# Patient Record
Sex: Female | Born: 1960 | Race: White | Hispanic: No | Smoking: Never smoker
Health system: Southern US, Community
[De-identification: ages and names within clinical notes are randomized; demographics above are authoritative.]

## PROBLEM LIST (undated history)

## (undated) DIAGNOSIS — F329 Major depressive disorder, single episode, unspecified: Secondary | ICD-10-CM

## (undated) DIAGNOSIS — E039 Hypothyroidism, unspecified: Secondary | ICD-10-CM

## (undated) DIAGNOSIS — R7303 Prediabetes: Secondary | ICD-10-CM

## (undated) DIAGNOSIS — I10 Essential (primary) hypertension: Secondary | ICD-10-CM

## (undated) DIAGNOSIS — IMO0001 Reserved for inherently not codable concepts without codable children: Secondary | ICD-10-CM

## (undated) DIAGNOSIS — F32A Depression, unspecified: Secondary | ICD-10-CM

## (undated) DIAGNOSIS — G4730 Sleep apnea, unspecified: Secondary | ICD-10-CM

## (undated) DIAGNOSIS — J069 Acute upper respiratory infection, unspecified: Secondary | ICD-10-CM

## (undated) DIAGNOSIS — E119 Type 2 diabetes mellitus without complications: Secondary | ICD-10-CM

## (undated) HISTORY — DX: Sleep apnea, unspecified: G47.30

## (undated) HISTORY — PX: NO PAST SURGERIES: SHX2092

## (undated) HISTORY — DX: Essential (primary) hypertension: I10

## (undated) HISTORY — PX: CATARACT EXTRACTION: SUR2

## (undated) HISTORY — DX: Morbid (severe) obesity due to excess calories: E66.01

## (undated) HISTORY — DX: Type 2 diabetes mellitus without complications: E11.9

## (undated) HISTORY — DX: Prediabetes: R73.03

## (undated) HISTORY — PX: DILATION AND CURETTAGE OF UTERUS: SHX78

---

## 1999-06-04 ENCOUNTER — Other Ambulatory Visit: Admission: RE | Admit: 1999-06-04 | Discharge: 1999-06-04 | Payer: Self-pay | Admitting: Obstetrics and Gynecology

## 2000-08-02 ENCOUNTER — Other Ambulatory Visit: Admission: RE | Admit: 2000-08-02 | Discharge: 2000-08-02 | Payer: Self-pay | Admitting: Obstetrics and Gynecology

## 2001-09-07 ENCOUNTER — Other Ambulatory Visit: Admission: RE | Admit: 2001-09-07 | Discharge: 2001-09-07 | Payer: Self-pay | Admitting: Obstetrics and Gynecology

## 2002-11-05 ENCOUNTER — Other Ambulatory Visit: Admission: RE | Admit: 2002-11-05 | Discharge: 2002-11-05 | Payer: Self-pay | Admitting: Obstetrics and Gynecology

## 2004-03-04 ENCOUNTER — Other Ambulatory Visit: Admission: RE | Admit: 2004-03-04 | Discharge: 2004-03-04 | Payer: Self-pay | Admitting: Obstetrics and Gynecology

## 2004-04-06 ENCOUNTER — Encounter: Admission: RE | Admit: 2004-04-06 | Discharge: 2004-04-06 | Payer: Self-pay | Admitting: Obstetrics and Gynecology

## 2005-10-13 ENCOUNTER — Other Ambulatory Visit: Admission: RE | Admit: 2005-10-13 | Discharge: 2005-10-13 | Payer: Self-pay | Admitting: Obstetrics and Gynecology

## 2010-12-06 ENCOUNTER — Encounter: Payer: Self-pay | Admitting: Obstetrics and Gynecology

## 2013-03-19 ENCOUNTER — Emergency Department (HOSPITAL_COMMUNITY)
Admission: EM | Admit: 2013-03-19 | Discharge: 2013-03-19 | Disposition: A | Discharge status: Home / Self Care | Payer: BC Managed Care – PPO | Attending: Emergency Medicine | Admitting: Emergency Medicine

## 2013-03-19 DIAGNOSIS — Y990 Civilian activity done for income or pay: Secondary | ICD-10-CM | POA: Insufficient documentation

## 2013-03-19 DIAGNOSIS — Z880 Allergy status to penicillin: Secondary | ICD-10-CM | POA: Insufficient documentation

## 2013-03-19 DIAGNOSIS — Z79899 Other long term (current) drug therapy: Secondary | ICD-10-CM | POA: Insufficient documentation

## 2013-03-19 DIAGNOSIS — W010XXA Fall on same level from slipping, tripping and stumbling without subsequent striking against object, initial encounter: Secondary | ICD-10-CM | POA: Insufficient documentation

## 2013-03-19 DIAGNOSIS — S81009A Unspecified open wound, unspecified knee, initial encounter: Secondary | ICD-10-CM | POA: Insufficient documentation

## 2013-03-19 DIAGNOSIS — Z23 Encounter for immunization: Secondary | ICD-10-CM | POA: Insufficient documentation

## 2013-03-19 DIAGNOSIS — S81812A Laceration without foreign body, left lower leg, initial encounter: Secondary | ICD-10-CM

## 2013-03-19 DIAGNOSIS — Y9269 Other specified industrial and construction area as the place of occurrence of the external cause: Secondary | ICD-10-CM | POA: Insufficient documentation

## 2013-03-19 MED ORDER — MUPIROCIN CALCIUM 2 % EX CREA
TOPICAL_CREAM | Freq: Two times a day (BID) | CUTANEOUS | Status: DC
  Administered 2013-03-19: 1 via TOPICAL
  Filled 2013-03-19: qty 15

## 2013-03-19 MED ORDER — LORAZEPAM 0.5 MG PO TABS
0.5000 mg | ORAL_TABLET | Freq: Once | ORAL | Status: AC
  Administered 2013-03-19: 0.5 mg via ORAL
  Filled 2013-03-19: qty 1

## 2013-03-19 MED ORDER — TETANUS-DIPHTH-ACELL PERTUSSIS 5-2.5-18.5 LF-MCG/0.5 IM SUSP
0.5000 mL | Freq: Once | INTRAMUSCULAR | Status: AC
  Administered 2013-03-19: 0.5 mL via INTRAMUSCULAR
  Filled 2013-03-19: qty 0.5

## 2013-03-19 NOTE — ED Provider Notes (Signed)
History  This chart was scribed for Gerhard Munch, MD by Ardeen Jourdain, ED Scribe. This patient was seen in room TR05C/TR05C and the patient's care was started at 1608.  CSN: [id number]  Arrival date & time 03/19/13  1227   None     Chief Complaint  Patient presents with  . Laceration     The history is provided by the patient. No language interpreter was used.    Tammy Wyatt is a 52 y.o. female who presents to the Emergency Department complaining of a laceration to her LLE. Pt states she was trying to step onto a pallet at work when she tripped and fell, cutting her leg. She denies any LOC, head trauma, confusion, nausea and emesis as associated symptoms. Pt states she is able to ambulate.    No past medical history on file.  No past surgical history on file.  No family history on file.  History  Substance Use Topics  . Smoking status: Not on file  . Smokeless tobacco: Not on file  . Alcohol Use: Not on file   No OB history available.  Review of Systems  Constitutional:       Per HPI, otherwise negative  HENT:       Per HPI, otherwise negative  Respiratory:       Per HPI, otherwise negative  Cardiovascular:       Per HPI, otherwise negative  Gastrointestinal: Negative for vomiting.  Endocrine:       Negative aside from HPI  Genitourinary:       Neg aside from HPI   Musculoskeletal:       Per HPI, otherwise negative  Skin: Negative.   Neurological: Negative for syncope.  All other systems reviewed and are negative.    Allergies  Penicillins  Home Medications   Current Outpatient Rx  Name  Route  Sig  Dispense  Refill  . clindamycin (CLEOCIN) 300 MG capsule   Oral   Take 300 mg by mouth every 6 (six) hours.         . furosemide (LASIX) 40 MG tablet   Oral   Take 40 mg by mouth daily.         Marland Kitchen lisinopril (PRINIVIL,ZESTRIL) 20 MG tablet   Oral   Take 20 mg by mouth daily.         . Multiple Vitamin (MULTIVITAMIN WITH MINERALS)  TABS   Oral   Take 1 tablet by mouth daily.         . mupirocin ointment (BACTROBAN) 2 %   Topical   Apply 1 application topically 3 (three) times daily.         . vitamin C (ASCORBIC ACID) 500 MG tablet   Oral   Take 500 mg by mouth daily.           Triage Vitals: BP 131/59  Pulse 80  Temp(Src) 98.9 F (37.2 C) (Oral)  Resp 24  SpO2 93%  LMP 01/17/2013  Physical Exam  Nursing note and vitals reviewed. Constitutional: She is oriented to person, place, and time. She appears well-developed and well-nourished. No distress.  Morbidly obese F NAD  HENT:  Head: Normocephalic and atraumatic.  Eyes: Conjunctivae and EOM are normal.  Cardiovascular: Normal rate and regular rhythm.   Pulmonary/Chest: Effort normal and breath sounds normal. No stridor. No respiratory distress.  Abdominal: She exhibits no distension.  Musculoskeletal: She exhibits no edema.  Good flexion and extension to the left lower extremity  (  ankle) - no tendon / muscle visible beneath the wound.  R anterior shin lac widely separated w significant tension across the wound.  Neurological: She is alert and oriented to person, place, and time. No cranial nerve deficit.  Skin: Skin is warm and dry. She is not diaphoretic.   6 cm well healing laceration to posterior left lower extremity consistent with prior cellulitis   Psychiatric: She has a normal mood and affect.    ED Course  Procedures (including critical care time)  Labs Reviewed - No data to display No results found.   No diagnosis found.  LACERATION REPAIR Performed by: Gerhard Munch Authorized by: Gerhard Munch Consent: Verbal consent obtained. Risks and benefits: risks, benefits and alternatives were discussed Consent given by: patient Patient identity confirmed: provided demographic data Prepped and Draped in normal sterile fashion Wound explored  Laceration Location: L shin  Laceration Length: 12 cm  No Foreign Bodies seen  or palpated  Anesthesia: local infiltration  Local anesthetic: lidocaine 2%  epinephrine  Anesthetic total: 13 ml  Irrigation method: syringe Amount of cleaning: standard  Skin closure: close - as much as possible with skin tension  Number of sutures: 12  Technique: interupted  Patient tolerance: Patient tolerated the procedure well with no immediate complications.   MDM   I personally performed the services described in this documentation, which was scribed in my presence. The recorded information has been reviewed and is accurate.  Patient presents of a mechanical fall with a left anterior shin laceration.  The patient is obese, with significant distention across anterior shin, but the wound was closely approximated with 12 sutures.  The patient is already taking antibiotics prior cellulitis, and was advised to continue these.  Hasn't other complaints, she is appropriate for discharge with close outpatient followup as needed.     Gerhard Munch, MD 03/19/13 [phone]

## 2013-03-19 NOTE — ED Notes (Signed)
tetanus shot was assessed earlier and given to patient at 1542

## 2013-03-19 NOTE — ED Notes (Signed)
Pt reports falling while going up one stair. NO LOC ,did not hit head. Lac to LLE .

## 2013-04-17 ENCOUNTER — Encounter (HOSPITAL_BASED_OUTPATIENT_CLINIC_OR_DEPARTMENT_OTHER): Payer: BC Managed Care – PPO | Attending: General Surgery

## 2013-04-17 DIAGNOSIS — I10 Essential (primary) hypertension: Secondary | ICD-10-CM | POA: Insufficient documentation

## 2013-04-17 DIAGNOSIS — T8189XA Other complications of procedures, not elsewhere classified, initial encounter: Secondary | ICD-10-CM | POA: Insufficient documentation

## 2013-04-17 DIAGNOSIS — Z880 Allergy status to penicillin: Secondary | ICD-10-CM | POA: Insufficient documentation

## 2013-04-17 DIAGNOSIS — L089 Local infection of the skin and subcutaneous tissue, unspecified: Secondary | ICD-10-CM | POA: Insufficient documentation

## 2013-04-17 DIAGNOSIS — Y849 Medical procedure, unspecified as the cause of abnormal reaction of the patient, or of later complication, without mention of misadventure at the time of the procedure: Secondary | ICD-10-CM | POA: Insufficient documentation

## 2013-04-17 DIAGNOSIS — L97909 Non-pressure chronic ulcer of unspecified part of unspecified lower leg with unspecified severity: Secondary | ICD-10-CM | POA: Insufficient documentation

## 2013-04-17 DIAGNOSIS — Z79899 Other long term (current) drug therapy: Secondary | ICD-10-CM | POA: Insufficient documentation

## 2013-04-18 ENCOUNTER — Encounter (HOSPITAL_COMMUNITY): Payer: Self-pay | Admitting: Cardiovascular Disease

## 2013-04-18 ENCOUNTER — Other Ambulatory Visit (HOSPITAL_COMMUNITY): Payer: Self-pay | Admitting: General Surgery

## 2013-04-18 DIAGNOSIS — I872 Venous insufficiency (chronic) (peripheral): Secondary | ICD-10-CM

## 2013-04-18 DIAGNOSIS — I739 Peripheral vascular disease, unspecified: Secondary | ICD-10-CM

## 2013-04-18 NOTE — Progress Notes (Signed)
[facility]  NAME:  Tammy Wyatt, Tammy Wyatt NO.:  [id number]  MEDICAL RECORD NO.:  [id number]      DATE OF BIRTH:  26-Jun-1961  PHYSICIAN:  Ardath Sax, M.D.      VISIT DATE:  04/17/2013                                  OFFICE VISIT   HISTORY:  This is a morbidly obese 52 year old Caucasian female who enters after suffering trauma to the anterior aspect of her left leg, and this was sutured and has come apart.  The sutures are all well, but she still has an open wound that is nonhealing wound, but it has been classified as venous hypertension and a chronic venous ulcer, and we are treating it with Santyl and an Radio broadcast assistant.  Today, I debrided some devitalized tissue out of the wound.  She was noted today to have a blood pressure of 161/90, respirations 18, temperature 98.7, pulse 75. She weighs 360 pounds and is 5 feet 3 inches tall.  She is on many medicines including she is on Bactrim for the infection on her legs. She is also on clindamycin.  She says she is allergic to penicillin. She also takes Lasix 40 mg a day and lisinopril 20 mg a day for hypertension.  I think this should heal up very nicely.  We eventually put some collagen on this wound when it is cleaned up, and we put Unna boot on her today for compression.  The wound is about 4-cm long, but it is only about 3-mm wide, and she has excellent pedal pulses, so I think she should heal reasonably quickly.     Ardath Sax, M.D.     PP/MEDQ  D:  04/17/2013  T:  04/18/2013  Job:  [id number]

## 2013-04-26 ENCOUNTER — Ambulatory Visit (HOSPITAL_BASED_OUTPATIENT_CLINIC_OR_DEPARTMENT_OTHER)
Admission: RE | Admit: 2013-04-26 | Discharge: 2013-04-26 | Disposition: A | Discharge status: Home / Self Care | Payer: BC Managed Care – PPO | Source: Ambulatory Visit | Attending: Cardiovascular Disease | Admitting: Cardiovascular Disease

## 2013-04-26 ENCOUNTER — Ambulatory Visit (HOSPITAL_COMMUNITY)
Admission: RE | Admit: 2013-04-26 | Discharge: 2013-04-26 | Disposition: A | Discharge status: Home / Self Care | Payer: BC Managed Care – PPO | Source: Ambulatory Visit | Attending: Cardiovascular Disease | Admitting: Cardiovascular Disease

## 2013-04-26 DIAGNOSIS — I739 Peripheral vascular disease, unspecified: Secondary | ICD-10-CM | POA: Insufficient documentation

## 2013-04-26 DIAGNOSIS — M79609 Pain in unspecified limb: Secondary | ICD-10-CM

## 2013-04-26 DIAGNOSIS — M7989 Other specified soft tissue disorders: Secondary | ICD-10-CM

## 2013-04-26 DIAGNOSIS — I872 Venous insufficiency (chronic) (peripheral): Secondary | ICD-10-CM

## 2013-04-26 NOTE — Progress Notes (Signed)
Venous Duplex Lower Ext. Completed. Deepa Barthel, RDMS, RVT  

## 2013-04-26 NOTE — Progress Notes (Signed)
Arterial Lower Ext. Duplex Completed. Raissa Dam, RDMS, RVT  

## 2013-05-16 ENCOUNTER — Encounter (HOSPITAL_BASED_OUTPATIENT_CLINIC_OR_DEPARTMENT_OTHER): Payer: BC Managed Care – PPO | Attending: General Surgery

## 2013-05-16 DIAGNOSIS — E669 Obesity, unspecified: Secondary | ICD-10-CM | POA: Insufficient documentation

## 2013-05-16 DIAGNOSIS — L97909 Non-pressure chronic ulcer of unspecified part of unspecified lower leg with unspecified severity: Secondary | ICD-10-CM | POA: Insufficient documentation

## 2013-06-20 ENCOUNTER — Encounter (HOSPITAL_BASED_OUTPATIENT_CLINIC_OR_DEPARTMENT_OTHER): Payer: BC Managed Care – PPO | Attending: General Surgery

## 2013-06-20 DIAGNOSIS — I87309 Chronic venous hypertension (idiopathic) without complications of unspecified lower extremity: Secondary | ICD-10-CM | POA: Insufficient documentation

## 2013-06-20 DIAGNOSIS — E119 Type 2 diabetes mellitus without complications: Secondary | ICD-10-CM | POA: Insufficient documentation

## 2013-06-20 DIAGNOSIS — I890 Lymphedema, not elsewhere classified: Secondary | ICD-10-CM | POA: Insufficient documentation

## 2013-06-20 DIAGNOSIS — L97809 Non-pressure chronic ulcer of other part of unspecified lower leg with unspecified severity: Secondary | ICD-10-CM | POA: Insufficient documentation

## 2013-07-05 NOTE — Progress Notes (Signed)
[facility]  NAME:  Tammy Wyatt, Tammy Wyatt NO.:  [id number]  MEDICAL RECORD NO.:  [id number]      DATE OF BIRTH:  Jun 21, 1961  PHYSICIAN:  Ardath Sax, M.D.           VISIT DATE:                                  OFFICE VISIT   This is a 52 year old morbidly obese lady who has severe venous hypertension and bilateral lymphedema of her lower extremities.  We have been treating her with Unna boots, and we have managed to clear the ulcers nicely.  I think the lymphedema is enough of her problem, and we are applying for home lymphedema pumps for her use 2-3 hours a day to get rid of some of this constant edema of her lower extremities.  I think this would help her diabetes, help her to exercise more and certainly help her to lose weight with the ability to exercise.     Ardath Sax, M.D.     PP/MEDQ  D:  07/04/2013  T:  07/05/2013  Job:  [id number]

## 2013-11-15 DIAGNOSIS — J069 Acute upper respiratory infection, unspecified: Secondary | ICD-10-CM

## 2013-11-15 HISTORY — DX: Acute upper respiratory infection, unspecified: J06.9

## 2014-05-09 ENCOUNTER — Ambulatory Visit: Payer: BC Managed Care – PPO | Attending: Nurse Practitioner | Admitting: Physical Therapy

## 2014-05-09 DIAGNOSIS — M545 Low back pain, unspecified: Secondary | ICD-10-CM | POA: Insufficient documentation

## 2014-05-09 DIAGNOSIS — M25559 Pain in unspecified hip: Secondary | ICD-10-CM | POA: Insufficient documentation

## 2014-05-09 DIAGNOSIS — IMO0001 Reserved for inherently not codable concepts without codable children: Secondary | ICD-10-CM | POA: Insufficient documentation

## 2014-05-21 ENCOUNTER — Encounter: Payer: BC Managed Care – PPO | Admitting: Rehabilitation

## 2014-05-23 ENCOUNTER — Encounter: Payer: BC Managed Care – PPO | Admitting: Rehabilitation

## 2014-05-27 ENCOUNTER — Encounter: Payer: BC Managed Care – PPO | Admitting: Physical Therapy

## 2014-05-30 ENCOUNTER — Encounter: Payer: BC Managed Care – PPO | Admitting: Rehabilitation

## 2014-11-13 ENCOUNTER — Other Ambulatory Visit: Payer: Self-pay

## 2014-11-13 ENCOUNTER — Encounter (HOSPITAL_COMMUNITY)
Admission: RE | Admit: 2014-11-13 | Discharge: 2014-11-13 | Disposition: A | Discharge status: Home / Self Care | Payer: BC Managed Care – PPO | Source: Ambulatory Visit | Attending: Obstetrics and Gynecology | Admitting: Obstetrics and Gynecology

## 2014-11-13 ENCOUNTER — Encounter (HOSPITAL_COMMUNITY): Payer: Self-pay

## 2014-11-13 ENCOUNTER — Encounter (HOSPITAL_COMMUNITY): Payer: Self-pay | Admitting: Anesthesiology

## 2014-11-13 DIAGNOSIS — Z01812 Encounter for preprocedural laboratory examination: Secondary | ICD-10-CM | POA: Diagnosis not present

## 2014-11-13 HISTORY — DX: Major depressive disorder, single episode, unspecified: F32.9

## 2014-11-13 HISTORY — DX: Hypothyroidism, unspecified: E03.9

## 2014-11-13 HISTORY — DX: Depression, unspecified: F32.A

## 2014-11-13 HISTORY — DX: Essential (primary) hypertension: I10

## 2014-11-13 HISTORY — DX: Acute upper respiratory infection, unspecified: J06.9

## 2014-11-13 HISTORY — DX: Reserved for inherently not codable concepts without codable children: IMO0001

## 2014-11-13 LAB — COMPREHENSIVE METABOLIC PANEL
ALK PHOS: 58 U/L (ref 39–117)
ALT: 23 U/L (ref 0–35)
AST: 31 U/L (ref 0–37)
Albumin: 3.5 g/dL (ref 3.5–5.2)
Anion gap: 7 (ref 5–15)
BILIRUBIN TOTAL: 0.4 mg/dL (ref 0.3–1.2)
BUN: 14 mg/dL (ref 6–23)
CALCIUM: 8.9 mg/dL (ref 8.4–10.5)
CO2: 38 mmol/L — AB (ref 19–32)
CREATININE: 0.5 mg/dL (ref 0.50–1.10)
Chloride: 97 mEq/L (ref 96–112)
GFR calc Af Amer: >90 mL/min (ref >90)
GFR calc non Af Amer: >90 mL/min (ref >90)
GLUCOSE: 113 mg/dL — AB (ref 70–99)
Potassium: 4.5 mmol/L (ref 3.5–5.1)
SODIUM: 142 mmol/L (ref 135–145)
Total Protein: 7.8 g/dL (ref 6.0–8.3)

## 2014-11-13 LAB — CBC
HEMATOCRIT: 41.5 % (ref 36.0–46.0)
Hemoglobin: 11.8 g/dL — ABNORMAL LOW (ref 12.0–15.0)
MCH: 24 pg — ABNORMAL LOW (ref 26.0–34.0)
MCHC: 28.4 g/dL — ABNORMAL LOW (ref 30.0–36.0)
MCV: 84.5 fL (ref 78.0–100.0)
Platelets: 205 10*3/uL (ref 150–400)
RBC: 4.91 MIL/uL (ref 3.87–5.11)
RDW: 17.1 % — AB (ref 11.5–15.5)
WBC: 6.3 10*3/uL (ref 4.0–10.5)

## 2014-11-13 NOTE — Progress Notes (Signed)
Called to discuss preoperative evaluate for D&C, hysteroscopy scheduled on 1/8. On reviewing her chart, last seen by a cardiologist in 12/12 and at that time, was placed on PRN lasix. A TTE was recommended but deferred at that time secondary to financial difficulties of the patient but CHF could not be ruled out given her symptoms. She currently has METs <4 which she attributes mostly to her weight but has also reported a history of feeling like she has "fluid on her lungs" and has been dyspneic. She reports baseline lower extremity edema and sleeps on several pillows at night. She reports a recent history of a cold and cough which produces yellow sputum and wheezing. She also has several risk factors for sleep apnea which has not been tested for or addressed. I feel that this patient would greatly benefit from medical optimization of her sleep apnea and further evaluation of her dyspnea and edema which could be cardiac in nature. If this is not an urgent surgery, her risk for anesthetic complications would be greatly reduced if these issues could be addressed and optimized.

## 2014-11-13 NOTE — Patient Instructions (Signed)
Your procedure is scheduled on:11/22/13  Enter through the Main Entrance at :6am Pick up desk phone and dial [phone] and inform us of your arrival.  Please call [phone] if you have any problems the morning of surgery.  Remember: Do not eat food or drink liquids, including water, after midnight:Thursday   You may brush your teeth the morning of surgery.  Take these meds the morning of surgery with a sip of water:Lisinopril, Celexa  DO NOT wear jewelry, eye make-up, lipstick,body lotion, or dark fingernail polish.  (Polished toes are ok) You may wear deodorant.  If you are to be admitted after surgery, leave suitcase in car until your room has been assigned. Patients discharged on the day of surgery will not be allowed to drive home. Wear loose fitting, comfortable clothes for your ride home.

## 2014-11-13 NOTE — Pre-Procedure Instructions (Signed)
Dr. Gentry RochJudd notified of pt's OSA screening score of 7, also pt's current URI with productive cough. Dr. Gentry RochJudd saw pt and wants pt to be cleared for surgery by her PMD.  Corrie DandyMary in Dr. Huel Coventryomblin's office notified that surgery needs to be rescheduled after pt has been cleared for surgery and health issues addressed prior to surgery, ie OSA and echo that was advised in 2012 but not done due to pt's lack of insurance at that time.

## 2014-11-22 ENCOUNTER — Encounter (HOSPITAL_COMMUNITY): Admission: RE | Payer: Self-pay | Source: Ambulatory Visit

## 2014-11-22 ENCOUNTER — Ambulatory Visit (HOSPITAL_COMMUNITY)
Admission: RE | Admit: 2014-11-22 | Payer: BC Managed Care – PPO | Source: Ambulatory Visit | Admitting: Obstetrics and Gynecology

## 2014-11-22 SURGERY — DILATATION & CURETTAGE/HYSTEROSCOPY WITH TRUCLEAR
Anesthesia: Choice

## 2014-12-19 ENCOUNTER — Ambulatory Visit: Payer: Self-pay | Admitting: Cardiology

## 2015-01-02 ENCOUNTER — Encounter (HOSPITAL_COMMUNITY): Payer: Self-pay | Admitting: Emergency Medicine

## 2015-01-02 ENCOUNTER — Emergency Department (HOSPITAL_COMMUNITY)
Admission: EM | Admit: 2015-01-02 | Discharge: 2015-01-02 | Disposition: A | Discharge status: Home / Self Care | Payer: BLUE CROSS/BLUE SHIELD | Source: Home / Self Care | Attending: Family Medicine | Admitting: Family Medicine

## 2015-01-02 DIAGNOSIS — J40 Bronchitis, not specified as acute or chronic: Secondary | ICD-10-CM

## 2015-01-02 MED ORDER — GUAIFENESIN-CODEINE 100-10 MG/5ML PO SOLN: 5.0000 mL | Freq: Every evening | ORAL | Status: DC | PRN

## 2015-01-02 MED ORDER — IPRATROPIUM-ALBUTEROL 0.5-2.5 (3) MG/3ML IN SOLN
RESPIRATORY_TRACT | Status: AC
  Filled 2015-01-02: qty 3

## 2015-01-02 MED ORDER — AZITHROMYCIN 250 MG PO TABS: 250.0000 mg | ORAL_TABLET | Freq: Every day | ORAL | Status: DC

## 2015-01-02 MED ORDER — IPRATROPIUM BROMIDE 0.06 % NA SOLN: 2.0000 | Freq: Four times a day (QID) | NASAL | Status: DC

## 2015-01-02 MED ORDER — IPRATROPIUM-ALBUTEROL 0.5-2.5 (3) MG/3ML IN SOLN
3.0000 mL | Freq: Once | RESPIRATORY_TRACT | Status: AC
  Administered 2015-01-02: 3 mL via RESPIRATORY_TRACT

## 2015-01-02 MED ORDER — PREDNISONE 10 MG PO TABS: 30.0000 mg | ORAL_TABLET | Freq: Every day | ORAL | Status: DC

## 2015-01-02 MED ORDER — ALBUTEROL SULFATE HFA 108 (90 BASE) MCG/ACT IN AERS: 2.0000 | INHALATION_SPRAY | Freq: Four times a day (QID) | RESPIRATORY_TRACT | Status: AC | PRN

## 2015-01-02 NOTE — ED Provider Notes (Signed)
Tammy Wyatt is a 54 y.o. female who presents to Urgent Care today for coughing congestion associated with wheezing and some shortness of breath. Symptoms have been present now 3-4 days. Her symptoms are consistent with previous episodes of bronchitis that were well treated with prednisone and azithromycin. No vomiting or diarrhea. No chest pains or palpitations.   Past Medical History  Diagnosis Date  . Depression   . Hypertension   . Shortness of breath dyspnea     on exertion  . Hypothyroidism     24 yrs ago  . URI (upper respiratory infection) 2015   Past Surgical History  Procedure Laterality Date  . No past surgeries     History  Substance Use Topics  . Smoking status: Never Smoker   . Smokeless tobacco: Not on file  . Alcohol Use: Yes     Comment: rarely   ROS as above Medications: No current facility-administered medications for this encounter.   Current Outpatient Prescriptions  Medication Sig Dispense Refill  . citalopram (CELEXA) 40 MG tablet Take 40 mg by mouth daily.    . furosemide (LASIX) 40 MG tablet Take 40 mg by mouth daily.    Marland Kitchen. ibuprofen (ADVIL,MOTRIN) 200 MG tablet Take 400 mg by mouth daily as needed for mild pain.    Marland Kitchen. lisinopril (PRINIVIL,ZESTRIL) 20 MG tablet Take 20 mg by mouth daily.    Marland Kitchen. albuterol (PROVENTIL HFA;VENTOLIN HFA) 108 (90 BASE) MCG/ACT inhaler Inhale 2 puffs into the lungs every 6 (six) hours as needed for wheezing or shortness of breath. 1 Inhaler 2  . azithromycin (ZITHROMAX) 250 MG tablet Take 1 tablet (250 mg total) by mouth daily. Take first 2 tablets together, then 1 every day until finished. 6 tablet 0  . guaiFENesin-codeine 100-10 MG/5ML syrup Take 5 mLs by mouth at bedtime as needed for cough. 120 mL 0  . ipratropium (ATROVENT) 0.06 % nasal spray Place 2 sprays into both nostrils 4 (four) times daily. 15 mL 1  . predniSONE (DELTASONE) 10 MG tablet Take 3 tablets (30 mg total) by mouth daily. 15 tablet 0   Allergies  Allergen  Reactions  . Penicillins Hives and Rash     Exam:  BP 152/86 mmHg  Pulse 70  Temp(Src) 98.3 F (36.8 C) (Oral)  Resp 20  SpO2 96%  LMP 12/12/2014 (Exact Date) Gen: Well NAD morbidly obese HEENT: EOMI,  MMM posterior pharynx with cobblestoning Lungs: Normal work of breathing. Wheezing present bilaterally Heart: RRR no MRG Abd: NABS, Soft. Nondistended, Nontender Exts: Brisk capillary refill, warm and well perfused.   Patient was given a 2.5/0.5 mg DuoNeb nebulizer treatment and felt better  No results found for this or any previous visit (from the past 24 hour(s)). No results found.  Assessment and Plan: 54 y.o. female with bronchitis. Treat with Atrovent nasal spray, prednisone, azithromycin, albuterol, and codeine cough syrup. Return as needed.  Discussed warning signs or symptoms. Please see discharge instructions. Patient expresses understanding.     Rodolph BongEvan S Bora Bost, MD 01/02/15 1224

## 2015-01-02 NOTE — Discharge Instructions (Signed)
Thank you for coming in today. Call or go to the emergency room if you get worse, have trouble breathing, have chest pains, or palpitations.  Take codeine at bedtime. Do not drive after taking this medication  Acute Bronchitis Bronchitis is inflammation of the airways that extend from the windpipe into the lungs (bronchi). The inflammation often causes mucus to develop. This leads to a cough, which is the most common symptom of bronchitis.  In acute bronchitis, the condition usually develops suddenly and goes away over time, usually in a couple weeks. Smoking, allergies, and asthma can make bronchitis worse. Repeated episodes of bronchitis may cause further lung problems.  CAUSES Acute bronchitis is most often caused by the same virus that causes a cold. The virus can spread from person to person (contagious) through coughing, sneezing, and touching contaminated objects. SIGNS AND SYMPTOMS   Cough.   Fever.   Coughing up mucus.   Body aches.   Chest congestion.   Chills.   Shortness of breath.   Sore throat.  DIAGNOSIS  Acute bronchitis is usually diagnosed through a physical exam. Your health care provider will also ask you questions about your medical history. Tests, such as chest X-rays, are sometimes done to rule out other conditions.  TREATMENT  Acute bronchitis usually goes away in a couple weeks. Oftentimes, no medical treatment is necessary. Medicines are sometimes given for relief of fever or cough. Antibiotic medicines are usually not needed but may be prescribed in certain situations. In some cases, an inhaler may be recommended to help reduce shortness of breath and control the cough. A cool mist vaporizer may also be used to help thin bronchial secretions and make it easier to clear the chest.  HOME CARE INSTRUCTIONS  Get plenty of rest.   Drink enough fluids to keep your urine clear or pale yellow (unless you have a medical condition that requires fluid  restriction). Increasing fluids may help thin your respiratory secretions (sputum) and reduce chest congestion, and it will prevent dehydration.   Take medicines only as directed by your health care provider.  If you were prescribed an antibiotic medicine, finish it all even if you start to feel better.  Avoid smoking and secondhand smoke. Exposure to cigarette smoke or irritating chemicals will make bronchitis worse. If you are a smoker, consider using nicotine gum or skin patches to help control withdrawal symptoms. Quitting smoking will help your lungs heal faster.   Reduce the chances of another bout of acute bronchitis by washing your hands frequently, avoiding people with cold symptoms, and trying not to touch your hands to your mouth, nose, or eyes.   Keep all follow-up visits as directed by your health care provider.  SEEK MEDICAL CARE IF: Your symptoms do not improve after 1 week of treatment.  SEEK IMMEDIATE MEDICAL CARE IF:  You develop an increased fever or chills.   You have chest pain.   You have severe shortness of breath.  You have bloody sputum.   You develop dehydration.  You faint or repeatedly feel like you are going to pass out.  You develop repeated vomiting.  You develop a severe headache. MAKE SURE YOU:   Understand these instructions.  Will watch your condition.  Will get help right away if you are not doing well or get worse. Document Released: 12/09/2004 Document Revised: 03/18/2014 Document Reviewed: 04/24/2013 [facility] Patient Information 2015 [place], [place]. This information is not intended to replace advice given to you by your health care  provider. Make sure you discuss any questions you have with your health care provider. ° °

## 2015-01-02 NOTE — ED Notes (Signed)
Pt has been suffering from a head cold and a cough for about a month.

## 2015-07-10 ENCOUNTER — Encounter: Payer: Self-pay | Admitting: Cardiology

## 2015-07-10 ENCOUNTER — Ambulatory Visit (INDEPENDENT_AMBULATORY_CARE_PROVIDER_SITE_OTHER): Payer: BLUE CROSS/BLUE SHIELD | Admitting: Cardiology

## 2015-07-10 VITALS — BP 120/82 | HR 69 | Ht 63.0 in | Wt 363.0 lb

## 2015-07-10 DIAGNOSIS — I10 Essential (primary) hypertension: Secondary | ICD-10-CM

## 2015-07-10 DIAGNOSIS — R600 Localized edema: Secondary | ICD-10-CM | POA: Diagnosis not present

## 2015-07-10 DIAGNOSIS — Z01818 Encounter for other preprocedural examination: Secondary | ICD-10-CM | POA: Diagnosis not present

## 2015-07-10 DIAGNOSIS — G4733 Obstructive sleep apnea (adult) (pediatric): Secondary | ICD-10-CM

## 2015-07-10 DIAGNOSIS — R7309 Other abnormal glucose: Secondary | ICD-10-CM

## 2015-07-10 DIAGNOSIS — R7303 Prediabetes: Secondary | ICD-10-CM | POA: Insufficient documentation

## 2015-07-10 HISTORY — DX: Essential (primary) hypertension: I10

## 2015-07-10 NOTE — Progress Notes (Signed)
Cardiology Office Note   Date:  07/10/2015   ID:  Tammy, Wyatt [birth year], MRN [id number]   PCP:  Harold Hedge, MD    Chief Complaint  Patient presents with  . preoperative cardiac clearance      History of Present Illness: Tammy Wyatt is a 54 y.o. female who presents for preoperative cardiac clearance prior to undergoing D&C.  She has a history of HTN, borderline DM, morbid obesity and depression.  She has a family history of CAD in her grandparents but at a later age.  She has never smoked but had significant second hand smoke exposure growing up.  On preoperative exam by anesthesiology, it was recommended that she have a sleep study to evaluate for OSA and also echocardiogram.  She denies any chest pain or pressure but does have DOE.  She has chronic LE edema and had venous dopplers in 2014 with chronic venous insufficiency.  She denies any dizziness or syncope.  She does notice palpitations but only on exerting herself.  She says that she has been told she snores and wakes herself up snoring.  She has been told that she stops breathing in her sleep.  She feels very tired in the am and sleeps in a chair due to back problems.  She thinks she sleeps only 4-5 hours nightly.  Her sister says that she falls asleep easily during the day.  She has nodded off a few times driving. She has been having heavy erratic menstrual bleeding and needs to undergo D&C.  Cardiology is now asked to assess cardiac risk for surgery.    Past Medical History  Diagnosis Date  . Depression   . Hypertension   . Shortness of breath dyspnea     on exertion  . Hypothyroidism     24 yrs ago  . URI (upper respiratory infection) 2015  . Pre-diabetes   . Morbid obesity     Past Surgical History  Procedure Laterality Date  . No past surgeries       Current Outpatient Prescriptions  Medication Sig Dispense Refill  . albuterol (PROVENTIL HFA;VENTOLIN HFA) 108 (90 BASE) MCG/ACT  inhaler Inhale 2 puffs into the lungs every 6 (six) hours as needed for wheezing or shortness of breath. 1 Inhaler 2  . citalopram (CELEXA) 40 MG tablet Take 40 mg by mouth daily.    . furosemide (LASIX) 40 MG tablet Take 40 mg by mouth daily.    Marland Kitchen ibuprofen (ADVIL,MOTRIN) 200 MG tablet Take 400 mg by mouth every 6 (six) hours as needed for mild pain.     Marland Kitchen lisinopril (PRINIVIL,ZESTRIL) 20 MG tablet Take 20 mg by mouth daily.     No current facility-administered medications for this visit.    Allergies:   Penicillins    Social History:  The patient  reports that she has never smoked. She does not have any smokeless tobacco history on file. She reports that she drinks alcohol. She reports that she does not use illicit drugs.   Family History:  The patient's family history includes COPD in her mother; Diabetes in her father; Heart failure in her father; Other in her brother, sister, and son.    ROS:  Please see the history of present illness.   Otherwise, review of systems are positive for none.   All other systems are reviewed and negative.    PHYSICAL EXAM: VS:  BP 120/82 mmHg  Pulse 69  Ht  (1.6 m)  Wt 363 lb (164.656 kg)  BMI 64.32 kg/m2  SpO2 95% , BMI Body mass index is 64.32 kg/(m^2). GEN: Well nourished, well developed, in no acute distress HEENT: normal Neck: no JVD, carotid bruits, or masses Cardiac: RRR; no murmurs, rubs, or gallops.  Trace LE edema with chronic venous stasis changes Respiratory:  clear to auscultation bilaterally, normal work of breathing GI: soft, nontender, nondistended, + BS MS: no deformity or atrophy Skin: warm and dry, no rash Neuro:  Strength and sensation are intact Psych: euthymic mood, full affect   EKG:  EKG is ordered today. The ekg ordered today demonstrates NSR at 69 bpm with no ST changes   Recent Labs: 11/13/2014: ALT 23; BUN 14; Creatinine, Ser 0.50; Hemoglobin 11.8*; Platelets 205; Potassium 4.5; Sodium 142    Lipid  Panel No results found for: CHOL, TRIG, HDL, CHOLHDL, VLDL, LDLCALC, LDLDIRECT    Wt Readings from Last 3 Encounters:  07/10/15 363 lb (164.656 kg)  11/13/14 372 lb (168.738 kg)     ASSESSMENT AND PLAN:  1.  Erratic heavy menstrual bleeding.  D&C has been recommended. 2.  Preoperative Cardiac Clearance.  She has multiple cardiac risk factors including morbid obesity, pre DM, HTN and long history of passive smoke exposure growing up.  She denies CP but complains of DOE which is most likely secondary to her morbid obesity and deconditioning.  Her EKG is nonischemic.  I have recommended that we proceed with ETT to rule out ischemia.  I will also get a 2D echo to assess LVF especially given her SOB and LE edema.   3.  Excessive daytime sleepiness and snoring in setting of morbid obesity.  I suspect that she has significant OSA.  I will set her up for split night sleep study prior to her surgery.  If she has significant sleep apnea she will be set up on CPAP therapy prior to surgery. 4.  Morbid obesity - I have had a long discussion with her regarding her weight and have recommended a referral to the Bariatric Surgery clinic for further evaluation. 5.  HTN - controlled on ACE I.    Current medicines are reviewed at length with the patient today.  The patient does not have concerns regarding medicines.  The following changes have been made:  no change  Labs/ tests ordered today: See above Assessment and Plan No orders of the defined types were placed in this encounter.   Disposition:   FU with me PRN pending results of studies   SignedQuintella Reichert, MD  07/10/2015 12:04 PM    [facility] [address] [place], [place], [place]  [address] Phone: [phone]; Fax: [phone]

## 2015-07-10 NOTE — Patient Instructions (Signed)
Medication Instructions:  Your physician recommends that you continue on your current medications as directed. Please refer to the Current Medication list given to you today.   Labwork: None  Testing/Procedures: Your physician has requested that you have an echocardiogram. Echocardiography is a painless test that uses sound waves to create images of your heart. It provides your doctor with information about the size and shape of your heart and how well your heart's chambers and valves are working. This procedure takes approximately one hour. There are no restrictions for this procedure.   Your physician has requested that you have an exercise tolerance test. For further information please visit [email or web address] Please also follow instruction sheet, as given.  Your physician has recommended that you have a sleep study AS SOON AS POSSIBLE. This test records several body functions during sleep, including: brain activity, eye movement, oxygen and carbon dioxide blood levels, heart rate and rhythm, breathing rate and rhythm, the flow of air through your mouth and nose, snoring, body muscle movements, and chest and belly movement.  Follow-Up: Please call [facility] at [phone] for Bariatric Orientation for weight loss surgery referral.  Your physician recommends that you schedule a follow-up appointment AS NEEDED with Dr. Mayford Knife pending your sleep study results.  Any Other Special Instructions Will Be Listed Below (If Applicable).

## 2015-07-14 ENCOUNTER — Ambulatory Visit (HOSPITAL_BASED_OUTPATIENT_CLINIC_OR_DEPARTMENT_OTHER): Payer: BLUE CROSS/BLUE SHIELD | Attending: Cardiology

## 2015-07-14 ENCOUNTER — Telehealth: Payer: Self-pay | Admitting: Cardiology

## 2015-07-14 DIAGNOSIS — Z01818 Encounter for other preprocedural examination: Secondary | ICD-10-CM

## 2015-07-14 DIAGNOSIS — R0683 Snoring: Secondary | ICD-10-CM | POA: Diagnosis not present

## 2015-07-14 DIAGNOSIS — G4733 Obstructive sleep apnea (adult) (pediatric): Secondary | ICD-10-CM | POA: Diagnosis not present

## 2015-07-14 NOTE — Telephone Encounter (Signed)
Left message for patient to call [phone] to register for classes. Instructed her to call back if she has further questions or concerns.

## 2015-07-14 NOTE — Telephone Encounter (Signed)
Informed patient that [facility] has been emailed to get correct number. Apologized for any inconvenience and told her she would be called with correct number. Patient grateful for callback.

## 2015-07-14 NOTE — Telephone Encounter (Signed)
New message        Dr Mayford Knife gave pt the number to talk to [facility] [place]---Bariatric orientation for wt loss---[phone].  That is the wrong number.  Pt is calling to get that number again---please call

## 2015-07-15 ENCOUNTER — Telehealth: Payer: Self-pay | Admitting: Cardiology

## 2015-07-15 DIAGNOSIS — G4733 Obstructive sleep apnea (adult) (pediatric): Secondary | ICD-10-CM

## 2015-07-15 NOTE — Sleep Study (Addendum)
SPLIT NIGHT SLEEP STUDY    Patient Name: Tammy Wyatt, Tammy Wyatt Date: 07/14/2015 MRN: [id number] Gender: Female D.O.B: 1960/11/22 Age (years): 50 Referring Provider: Fransico Him MD, ABSM Interpreting Physician: Fransico Him MD, ABSM RPSGT: Pragya, Lofaso  BMI: 64 Weight (lbs): 363 Height (inches): 63 Neck Size: 19.00  CLINICAL INFORMATION  Sleep Study Type: Split Night CPAP  Indication for sleep study: OSA  Epworth Sleepiness Score: 14  SLEEP STUDY TECHNIQUE As per the AASM Manual for the Scoring of Sleep and Associated Events v2.3 (April 2016) with a hypopnea requiring 4% desaturations.  The channels recorded and monitored were frontal, central and occipital EEG, electrooculogram (EOG), submentalis EMG (chin), nasal and oral airflow, thoracic and abdominal wall motion, anterior tibialis EMG, snore microphone, electrocardiogram, and pulse oximetry. Continuous positive airway pressure (CPAP) was initiated when the patient met split night criteria and was titrated according to treat sleep-disordered breathing.  MEDICATIONS Medications taken by the patient : Albuterol, Celexa, Lisinopril, Advil, Lasix Medications administered by patient during sleep study : No sleep medicine administered.  RESPIRATORY PARAMETERS  Diagnostic Total AHI (/hr): 74.2  RDI (/hr):77.4   OA Index (/hr): 3.7   CA Index (/hr): 0.0 REM AHI (/hr): N/A  NREM AHI (/hr):74.2  Supine AHI (/hr):73.2   Non-supine AHI (/hr): 75.52 Min O2 Sat (%):70.00  Mean O2 (%): 83.35  Time below 88% (min):114.4    Titration Optimal Pressure (cm):N/A  AHI at Optimal Pressure (/hr):N/A Min O2 at Optimal Pressure (%):N/A Supine % at Optimal (%):N/A  Sleep % at Optimal (%):N/A    SLEEP ARCHITECTURE The recording time for the entire night was 371.1 minutes.  During a baseline period of 186.6 minutes, the patient slept for 131.0 minutes in REM and nonREM, yielding a seduced sleep efficiency of 70.2%. Sleep onset after  lights out was 14.4 minutes with a REM latency of N/A minutes. The patient spent 4.58% of the night in stage N1 sleep, 95.42% in stage N2 sleep, 0.00% in stage N3 and 0.00% in REM.    During the titration period of 175.7 minutes, the patient slept for 158.2 minutes in REM and nonREM, yielding a sleep efficiency of 90.0%. Sleep onset after CPAP initiation was 11.5 minutes with a REM latency of 42.5 minutes. The patient spent 0.63% of the night in stage N1 sleep, 38.68% in stage N2 sleep, 7.27% in stage N3 and 53.42% in REM.  CARDIAC DATA The 2 lead EKG demonstrated sinus rhythm. The mean heart rate was 85.30 beats per minute. Other EKG findings include: None.  LEG MOVEMENT DATA The total Periodic Limb Movements of Sleep (PLMS) were 0. The PLMS index was 0.00 .  IMPRESSIONS Severe obstructive sleep apnea occurred during the diagnostic portion of the study (AHI = 74.2/hour). An optimal PAP pressure was selected for this patient ( 11 cm of water) No significant central sleep apnea occurred during the diagnostic portion of the study (CAI = 0.0/hour). Severe oxygen desaturation was noted during the diagnostic portion of the study (Min O2 = 70.00%). The patient snored with Moderate snoring volume during the diagnostic portion of the study. No cardiac abnormalities were noted during this study. Clinically significant periodic limb movements did not occur during sleep.  DIAGNOSIS Obstructive Sleep Apnea (327.23 [G47.33 ICD-10])  RECOMMENDATIONS The patient was tried but unsuccessful at CPAP titration due to lack of sleep time to have successful titration and ongoing respiratory events.  Patient was unable to reach REM supine sleep.   Recommend full night CPAP titration with possible  BiPAP. Avoid alcohol, sedatives and other CNS depressants that may worsen sleep apnea and disrupt normal sleep architecture. Sleep hygiene should be reviewed to assess factors that may improve sleep quality. Weight  management and regular exercise should be initiated or continued.   Signed: Fransico Him, MD ABSM Diplomate, American Board of Sleep Medicine 07/15/2015

## 2015-07-15 NOTE — Telephone Encounter (Signed)
Please let patient know that they have sleep apnea but not enough time for successful CPAP titration.  Recommend CPAP titration. Please set up titration in the sleep lab.

## 2015-07-17 ENCOUNTER — Encounter: Payer: Self-pay | Admitting: *Deleted

## 2015-07-17 NOTE — Telephone Encounter (Signed)
Patient informed of results.  Stated verbal understanding.  Letter will be sent with the date

## 2015-07-17 NOTE — Addendum Note (Signed)
Addended by: Arcola Jansky on: 07/17/2015 01:32 PM   Modules accepted: Orders

## 2015-07-24 ENCOUNTER — Ambulatory Visit (HOSPITAL_COMMUNITY): Payer: BLUE CROSS/BLUE SHIELD | Attending: Cardiology

## 2015-07-24 ENCOUNTER — Other Ambulatory Visit: Payer: Self-pay

## 2015-07-24 DIAGNOSIS — I10 Essential (primary) hypertension: Secondary | ICD-10-CM | POA: Insufficient documentation

## 2015-07-24 DIAGNOSIS — E119 Type 2 diabetes mellitus without complications: Secondary | ICD-10-CM | POA: Diagnosis not present

## 2015-07-24 DIAGNOSIS — E039 Hypothyroidism, unspecified: Secondary | ICD-10-CM | POA: Insufficient documentation

## 2015-07-24 DIAGNOSIS — R600 Localized edema: Secondary | ICD-10-CM | POA: Insufficient documentation

## 2015-07-24 DIAGNOSIS — Z01818 Encounter for other preprocedural examination: Secondary | ICD-10-CM | POA: Diagnosis not present

## 2015-07-24 MED ORDER — PERFLUTREN LIPID MICROSPHERE
2.0000 mL | Freq: Once | INTRAVENOUS | Status: AC
  Administered 2015-07-24: 2 mL via INTRAVENOUS

## 2015-07-29 ENCOUNTER — Ambulatory Visit (HOSPITAL_BASED_OUTPATIENT_CLINIC_OR_DEPARTMENT_OTHER): Payer: BLUE CROSS/BLUE SHIELD | Attending: Cardiology

## 2015-07-29 DIAGNOSIS — G4730 Sleep apnea, unspecified: Secondary | ICD-10-CM | POA: Diagnosis present

## 2015-07-29 DIAGNOSIS — R0683 Snoring: Secondary | ICD-10-CM | POA: Insufficient documentation

## 2015-07-29 DIAGNOSIS — G4733 Obstructive sleep apnea (adult) (pediatric): Secondary | ICD-10-CM | POA: Insufficient documentation

## 2015-07-30 ENCOUNTER — Telehealth: Payer: Self-pay | Admitting: Cardiology

## 2015-07-30 DIAGNOSIS — G4733 Obstructive sleep apnea (adult) (pediatric): Secondary | ICD-10-CM

## 2015-07-30 DIAGNOSIS — R0902 Hypoxemia: Secondary | ICD-10-CM

## 2015-07-30 NOTE — Telephone Encounter (Signed)
Pt had successful PAP titration. Please setup appointment in 10 weeks. Please let [facility] know that order for PAP is in EPIC.  Please order an overnight Pulse oximetry with CPAP to assess for ongoing hypoxemia

## 2015-07-30 NOTE — Sleep Study (Signed)
   Patient Name: Tammy Wyatt, Tammy Wyatt MRN: [id number] Study Date: 07/29/2015 Gender: Female D.O.B: 09/26/1961 Age (years): 73 Referring Provider: Armanda Magic MD, ABSM Interpreting Physician: Armanda Magic MD, ABSM RPSGT: Araly, Kaas  Height (inches): 63 Weight (lbs): 363 BMI: 64 Neck Size: 19.0  CLINICAL INFORMATION The patient is referred for a CPAP titration to treat sleep apnea.  Date of NPSG, Split Night or HST: 07/15/2015  SLEEP STUDY TECHNIQUE As per the AASM Manual for the Scoring of Sleep and Associated Events v2.3 (April 2016) with a hypopnea requiring 4% desaturations.  The channels recorded and monitored were frontal, central and occipital EEG, electrooculogram (EOG), submentalis EMG (chin), nasal and oral airflow, thoracic and abdominal wall motion, anterior tibialis EMG, snore microphone, electrocardiogram, and pulse oximetry. Continuous positive airway pressure (CPAP) was initiated at the beginning of the study and titrated to treat sleep-disordered breathing.  MEDICATIONS Medications taken by the patient : Albuterol, Celexa, Lasix, Advil, Lisinopril Medications administered by patient during sleep study : No sleep medicine administered.  TECHNICIAN COMMENTS Comments added by technician: NONE  Comments added by scorer: N/A  RESPIRATORY PARAMETERS Optimal PAP Pressure (cm): 14  AHI at Optimal Pressure (/hr):6.2 Overall Minimal O2 (%):79.00   Supine % at Optimal Pressure (%):100 Minimal O2 at Optimal Pressure (%):87.0    SLEEP ARCHITECTURE The study was initiated at 11:00:44 PM and ended at 5:10:52 AM.  Sleep onset time was 14.3 minutes and the sleep efficiency was reduced at 62.4%. The total sleep time was 231.0 minutes.  The patient spent 1.73% of the night in stage N1 sleep, 58.23% in stage N2 sleep, 25.76% in stage N3 and 14.29% in REM.Stage REM latency was 145.5 minutes  Wake after sleep onset was 124.8. Alpha intrusion was absent. Supine sleep was  100.00%.  CARDIAC DATA The 2 lead EKG demonstrated sinus rhythm. The mean heart rate was 74.27 beats per minute. Other EKG findings include: None.  LEG MOVEMENT DATA The total Periodic Limb Movements of Sleep (PLMS) were 0. The PLMS index was 0.00. A PLMS index of <15 is considered normal in adults.  IMPRESSIONS The optimal PAP pressure was 14 cm of water. Central sleep apnea was not noted during this titration (CAI = 0.0/h). Severe oxygen desaturations were observed during this titration (min O2 = 79.00%). The patient snored with Soft snoring volume during this titration study. No cardiac abnormalities were observed during this study. Clinically significant periodic limb movements were not noted during this study. Arousals associated with PLMs were rare.  DIAGNOSIS Obstructive Sleep Apnea (327.23 [G47.33 ICD-10])  RECOMMENDATIONS Trial of CPAP therapy on 14 cm H2O with a Small size Fisher&Paykel Full Face Mask Simplus mask and heated humidification. Avoid alcohol, sedatives and other CNS depressants that may worsen sleep apnea and disrupt normal sleep architecture. Sleep hygiene should be reviewed to assess factors that may improve sleep quality. Weight management and regular exercise should be initiated or continued. Return to Sleep Center for re-evaluation after 10 weeks of therapy and download in 4 weeks to assess compliance.   Overnight pulse oximetry on CPAP to evaluate if supplemental O2 needed with CPAP.     Quintella Reichert Diplomate, American Board of Sleep Medicine  ELECTRONICALLY SIGNED ON:  07/30/2015, 1:18 PM [facility] PH: [phone]   FX: [phone] ACCREDITED BY THE AMERICAN ACADEMY OF SLEEP MEDICINE

## 2015-07-30 NOTE — Addendum Note (Signed)
Addended by: Armanda Magic R on: 07/30/2015 01:22 PM   Modules accepted: Orders

## 2015-07-31 NOTE — Addendum Note (Signed)
Addended by: Arcola Jansky on: 07/31/2015 10:05 AM   Modules accepted: Orders

## 2015-07-31 NOTE — Telephone Encounter (Signed)
Patient is aware of results. [facility] Notified.  Orders in for ONO on CPAP.  Once patient receives her machine we will schedule a 10 week Follow-Up

## 2015-08-06 ENCOUNTER — Inpatient Hospital Stay (HOSPITAL_COMMUNITY): Admission: RE | Admit: 2015-08-06 | Payer: BLUE CROSS/BLUE SHIELD | Source: Ambulatory Visit

## 2015-08-21 ENCOUNTER — Inpatient Hospital Stay (HOSPITAL_COMMUNITY): Admission: RE | Admit: 2015-08-21 | Payer: BLUE CROSS/BLUE SHIELD | Source: Ambulatory Visit

## 2015-09-16 ENCOUNTER — Telehealth (HOSPITAL_COMMUNITY): Payer: Self-pay

## 2015-09-16 NOTE — Telephone Encounter (Signed)
Encounter complete. 

## 2015-09-18 ENCOUNTER — Ambulatory Visit (HOSPITAL_COMMUNITY)
Admission: RE | Admit: 2015-09-18 | Discharge: 2015-09-18 | Disposition: A | Discharge status: Home / Self Care | Payer: BLUE CROSS/BLUE SHIELD | Source: Ambulatory Visit | Attending: Cardiovascular Disease | Admitting: Cardiovascular Disease

## 2015-09-18 DIAGNOSIS — Z01818 Encounter for other preprocedural examination: Secondary | ICD-10-CM | POA: Diagnosis not present

## 2015-09-18 DIAGNOSIS — R9439 Abnormal result of other cardiovascular function study: Secondary | ICD-10-CM | POA: Insufficient documentation

## 2015-09-18 DIAGNOSIS — R600 Localized edema: Secondary | ICD-10-CM | POA: Insufficient documentation

## 2015-09-19 LAB — EXERCISE TOLERANCE TEST
CHL CUP MPHR: 168 {beats}/min
CHL RATE OF PERCEIVED EXERTION: 17
CSEPEW: 4.6 METS
CSEPHR: 81 %
Exercise duration (min): 2 min
Exercise duration (sec): 2 s
Peak HR: 137 {beats}/min
Rest HR: 84 {beats}/min

## 2015-09-23 ENCOUNTER — Telehealth: Payer: Self-pay

## 2015-09-23 DIAGNOSIS — R9439 Abnormal result of other cardiovascular function study: Secondary | ICD-10-CM

## 2015-09-23 NOTE — Telephone Encounter (Signed)
-----   Message from Quintella Reichertraci R Turner, MD sent at 09/21/2015  7:42 PM EST ----- Patient is morbidly obese which most likely accounts for markedly reduced aerobic capacity.  Please let patient know that we need Lexiscan myoview due to inability to achieve her 85% maximum HR before surgical clearance can be given.  This will need to be a 2 day study.

## 2015-09-23 NOTE — Telephone Encounter (Signed)
Informed patient of results and verbal understanding expressed.  Lexiscan myoview instructed reviewed and ordered for scheduling. Patient agrees with treatment plan.

## 2015-09-30 ENCOUNTER — Telehealth (HOSPITAL_COMMUNITY): Payer: Self-pay

## 2015-09-30 NOTE — Telephone Encounter (Signed)
Left message on voicemail in reference to upcoming appointment scheduled for 10-03-2015. Phone number given for a call back so details instructions can be given. Lorretta Kerce A   

## 2015-10-01 ENCOUNTER — Telehealth (HOSPITAL_COMMUNITY): Payer: Self-pay

## 2015-10-01 NOTE — Telephone Encounter (Signed)
Left message on voicemail in reference to upcoming appointment scheduled for 10-03-2015. Phone number given for a call back so details instructions can be given. Krishna Heuer A   

## 2015-10-03 ENCOUNTER — Ambulatory Visit (HOSPITAL_COMMUNITY): Payer: BLUE CROSS/BLUE SHIELD | Attending: Cardiology

## 2015-10-03 DIAGNOSIS — R9439 Abnormal result of other cardiovascular function study: Secondary | ICD-10-CM

## 2015-10-03 DIAGNOSIS — I10 Essential (primary) hypertension: Secondary | ICD-10-CM | POA: Insufficient documentation

## 2015-10-03 DIAGNOSIS — R0609 Other forms of dyspnea: Secondary | ICD-10-CM | POA: Insufficient documentation

## 2015-10-03 MED ORDER — TECHNETIUM TC 99M SESTAMIBI GENERIC - CARDIOLITE
31.2000 | Freq: Once | INTRAVENOUS | Status: AC | PRN
  Administered 2015-10-03: 31.2 via INTRAVENOUS

## 2015-10-03 MED ORDER — REGADENOSON 0.4 MG/5ML IV SOLN
0.4000 mg | Freq: Once | INTRAVENOUS | Status: AC
  Administered 2015-10-03: 0.4 mg via INTRAVENOUS

## 2015-10-05 ENCOUNTER — Encounter (HOSPITAL_BASED_OUTPATIENT_CLINIC_OR_DEPARTMENT_OTHER): Payer: BLUE CROSS/BLUE SHIELD

## 2015-10-06 ENCOUNTER — Ambulatory Visit (HOSPITAL_COMMUNITY): Payer: BLUE CROSS/BLUE SHIELD | Attending: Cardiology

## 2015-10-06 DIAGNOSIS — R0989 Other specified symptoms and signs involving the circulatory and respiratory systems: Secondary | ICD-10-CM

## 2015-10-06 LAB — MYOCARDIAL PERFUSION IMAGING
CHL CUP NUCLEAR SDS: 5
CHL CUP NUCLEAR SSS: 10
CHL CUP RESTING HR STRESS: 73 {beats}/min
LV dias vol: 173 mL
LVSYSVOL: 80 mL
Peak HR: 90 {beats}/min
RATE: 0.27
SRS: 5
TID: 1.11

## 2015-10-06 MED ORDER — TECHNETIUM TC 99M SESTAMIBI GENERIC - CARDIOLITE
33.0000 | Freq: Once | INTRAVENOUS | Status: AC | PRN
  Administered 2015-10-06: 33 via INTRAVENOUS

## 2015-10-21 ENCOUNTER — Telehealth: Payer: Self-pay | Admitting: Cardiology

## 2015-10-21 NOTE — Telephone Encounter (Signed)
Please get a download from her CPAP device

## 2015-10-21 NOTE — Telephone Encounter (Signed)
New message      Calling to make sure Dr Mayford Knifeurner sends Dr Huntley Decomlin the nuclear stress test results and clearance letter so that pt can have surgery.

## 2015-10-22 NOTE — Telephone Encounter (Signed)
CORRECTION:   Patient did not cancel her CPAP titration. She canceled her Appointment with [facility] to get her CPAP machine.  She is calling them today to reschedule that appointment

## 2015-10-22 NOTE — Telephone Encounter (Signed)
Patient is not on CPAP yet.   She has canceled her CPAP titration and has not called to reschedule like she was supposed to. I spoke with her again today to let her know that we need to get that CPAP Titration rescheduled.    She stated verbal understanding and said she would call me back when she leaves the [place] today.

## 2015-10-22 NOTE — Telephone Encounter (Signed)
Patient is low risk from a cardiac standpoint for surgery.  Her 2D echo showed normal LVF.  Nuclear stress test was low risk with breast attenuation.  Ok to proceed with surgery.  Patient needs to be instructed to bring her CPAP device which should be used in post op recovery if needed given her severe OSA.

## 2015-10-23 NOTE — Telephone Encounter (Signed)
Forwarded to Dr. Henderson Cloudomblin.

## 2016-02-26 ENCOUNTER — Encounter: Payer: BLUE CROSS/BLUE SHIELD | Attending: Family | Admitting: Nutrition

## 2016-02-26 ENCOUNTER — Encounter: Payer: Self-pay | Admitting: Nutrition

## 2016-02-26 VITALS — Ht 63.0 in | Wt 335.0 lb

## 2016-02-26 DIAGNOSIS — Z6841 Body Mass Index (BMI) 40.0 and over, adult: Secondary | ICD-10-CM | POA: Diagnosis not present

## 2016-02-26 DIAGNOSIS — E1165 Type 2 diabetes mellitus with hyperglycemia: Secondary | ICD-10-CM

## 2016-02-26 DIAGNOSIS — IMO0002 Reserved for concepts with insufficient information to code with codable children: Secondary | ICD-10-CM

## 2016-02-26 DIAGNOSIS — E118 Type 2 diabetes mellitus with unspecified complications: Secondary | ICD-10-CM

## 2016-02-26 NOTE — Progress Notes (Signed)
  Medical Nutrition Therapy:  Appt start time: 1100 end time:  1200.   Assessment:  Primary concerns today: Diabetes Type 2. A1C 11.4%. Sees [facility]> Metformin 500 mg BID x 1 month. FBS:  136-170 mg/dl Eating 2 -3 meals per day. Lost 32 lbs over the last year. Physical actviity: walks 1 mile most days. Changed in teh last month: cut out snacks, has cut down on bread and now eating whole wheat, quit drinking diet sodas, cut out a lot sweets. May have sugar free stoffers at times now. Testing blood sugars on and off. Works for a [employer] and has been very busy and stressed. Is a stress and emotional eater she admits. Metformin 500 mg BID Diet is excessive in carbs, calories, sodium and fat and low in fresh fruits and vegetables.  Preferred Learning Style:    Visual  Hands on  Learning Readiness:   Ready  Change in progress   MEDICATIONS: See list   DIETARY INTAKE:  24-hr recall:  B ( AM):  Bacon, egg and cheese mcgriddle and parfait,  Snk ( AM): none   L ( PM): Chicken fajitas 1 wrap and beans and quamole  With unsweet tea Snk ( PM): none D ( PM): 1 1/2 c of baked spaghetti, unsweet tea, Snk ( PM): Beverages:  Water, unsweet tea.  Usual physical activity: ADL  Estimated energy needs: 1500 calories 170 g carbohydrates 112 g protein 42 g fat  Progress Towards Goal(s):  In progress.   Nutritional Diagnosis:  NB-1.1 Food and nutrition-related knowledge deficit As related to Diabetes .  As evidenced by A1C 11.4%..    Intervention:  Nutrition and Diabetes education provided on My Plate, CHO counting, meal planning, portion sizes, timing of meals, avoiding snacks between meals unless having a low blood sugar, target ranges for A1C and blood sugars, signs/symptoms and treatment of hyper/hypoglycemia, monitoring blood sugars, taking medications as prescribed, benefits of exercising 30 minutes per day and prevention of complications of DM. Weight loss tips. Goals 1. Follow My  Plate 2. Eat 2-3 carb choices per meal 3. Avoid snacks between meals. 4. Increase fresh fruits and low carb vegetables. 5. Drink only water 6. Eat meals on time. 7. Take Metformin as prescribed and dont forget eveing dose. 8. Walk 15-30 minutes three times per week. 9 Lose 1 lb per week 10 Get A1C to 7%  Teaching Method Utilized:  Visual Auditory Hands on  Handouts given during visit include:  The Plate Method  Meal Plan Card  Diabetes Instructions. Barriers to learning/adherence to lifestyle change: None   Demonstrated degree of understanding via:  Teach Back   Monitoring/Evaluation:  Dietary intake, exercise, meal planning, SBG, and body weight in 1 week(s).

## 2016-03-04 ENCOUNTER — Encounter: Payer: BLUE CROSS/BLUE SHIELD | Admitting: Nutrition

## 2016-03-04 VITALS — Ht 63.0 in | Wt 340.0 lb

## 2016-03-04 DIAGNOSIS — E118 Type 2 diabetes mellitus with unspecified complications: Principal | ICD-10-CM

## 2016-03-04 DIAGNOSIS — E1165 Type 2 diabetes mellitus with hyperglycemia: Secondary | ICD-10-CM

## 2016-03-04 DIAGNOSIS — IMO0002 Reserved for concepts with insufficient information to code with codable children: Secondary | ICD-10-CM

## 2016-03-04 DIAGNOSIS — E669 Obesity, unspecified: Secondary | ICD-10-CM

## 2016-03-04 NOTE — Patient Instructions (Signed)
Goals 1.  Follow My Plate 2.  Take Metformin as prescribed. 3.  Walk with 30 three times per week. 4.  Read food labels for carbs and sodium 5. Lose 1 lb per week.

## 2016-03-04 NOTE — Progress Notes (Signed)
  Medical Nutrition Therapy:  Appt start time: 1200 end time:  1230.  Assessment:  Primary concerns today: Diabetes Type 2. A1C 11.4%. Follow up after one week. AVG blood sugar now is 154 mg/dl with 4 testing daily x 1 week. BS much better. Sees [facility] Metformin 500 mg BID x 1 month. FBS:  136-170 mg/dl Eating 3 meals per day. Gained 5-6 lbs since last week. She doesn't know why. Physical actviity: walks 1 mile most days-- hasn't been walking as much due to tax season..Has continued to cut out snacks, has cut down on bread and now eating whole wheat, quit drinking diet sodas, cut out a lot sweets. Cut out sugar free candies.  Metformin 500 mg BID. Admits she sometimes forgets to take her second dose of Metformin at night.  DIet is much improved. Would benefit from a SGLT2 for needed weight loss and further improved blood sugars.  Preferred Learning Style:    Visual  Hands on  Learning Readiness:   Ready  Change in progres   MEDICATIONS: See list   DIETARY INTAKE:  24-hr recall:  B ( AM):  Whole wheat sf bread with pb, boiled egg, Snk ( AM): none  : sometimes veggies. L ( PM): Veggies, [place]urkey  Breast 3  with pickled okra,  unsweet tea Snk ( PM): none D ( PM): 1 1/2 c of baked spaghetti, unsweet tea, Snk ( PM): Beverages:  Water, unsweet tea.  Usual physical activity: Walking some  Estimated energy needs: 1500 calories 170 g carbohydrates 112 g protein 42 g fat  Progress Towards Goal(s):  Some progress.   Nutritional Diagnosis:  NB-1.1 Food and nutrition-related knowledge deficit As related to DM.  As evidenced by A1C 11.3%..    Intervention:  Nutrition  Goals 1.  Follow My Plate 2.  Take Metformin as prescribed-- 500 mg BID. Don't forget evening dose. 3.  Walk with 30 mins  three times per week. 4.  Read food labels for carbs and sodium content of foods. 5. Lose 1 lb per week til next visit.  Teaching Method Utilized:  Visual Auditory Hands on  Handouts given  during visit include:  The Plate Method  Meal Plan Card  Diabetes Instructions  Barriers to learning/adherence to lifestyle change:  None  Demonstrated degree of understanding via:  Teach Back   Monitoring/Evaluation:  Dietary intake, exercise, meal planning, SBG, and body weight in 1 month(s). If desired, a SGLT2 medication may be beneficial in future for assistance with weight control and  BS if needed.

## 2016-03-11 NOTE — Patient Instructions (Signed)
Goals 1. Follow My Plate 2. Eat 2-3 carb choices per meal 3. Avoid snacks between meals. 4. Increase fresh fruits and low carb vegetables. 5. Drink only water 6. Eat meals on time. 7. Take Metformin as prescribed and dont forget eveing dose. 8. Walk 15-30 minutes three times per week. 9 Lose 1 lb per week 10 Get A1C to 7%

## 2016-04-01 ENCOUNTER — Ambulatory Visit: Payer: BLUE CROSS/BLUE SHIELD | Admitting: Nutrition

## 2016-04-01 ENCOUNTER — Telehealth: Payer: Self-pay | Admitting: Nutrition

## 2016-04-01 NOTE — Telephone Encounter (Signed)
vm reminder of appt 

## 2016-05-07 ENCOUNTER — Encounter: Payer: Self-pay | Admitting: Nutrition

## 2021-01-30 ENCOUNTER — Ambulatory Visit (INDEPENDENT_AMBULATORY_CARE_PROVIDER_SITE_OTHER): Payer: BLUE CROSS/BLUE SHIELD | Admitting: Urology

## 2021-01-30 ENCOUNTER — Ambulatory Visit (HOSPITAL_COMMUNITY)
Admission: RE | Admit: 2021-01-30 | Discharge: 2021-01-30 | Disposition: A | Discharge status: Home / Self Care | Payer: BLUE CROSS/BLUE SHIELD | Source: Ambulatory Visit | Attending: Urology | Admitting: Urology

## 2021-01-30 ENCOUNTER — Encounter: Payer: Self-pay | Admitting: Urology

## 2021-01-30 ENCOUNTER — Other Ambulatory Visit: Payer: Self-pay

## 2021-01-30 VITALS — BP 166/85 | HR 92 | Temp 98.0°F | Ht 62.0 in | Wt 318.0 lb

## 2021-01-30 DIAGNOSIS — N200 Calculus of kidney: Secondary | ICD-10-CM

## 2021-01-30 DIAGNOSIS — N390 Urinary tract infection, site not specified: Secondary | ICD-10-CM

## 2021-01-30 LAB — URINALYSIS, ROUTINE W REFLEX MICROSCOPIC
Bilirubin, UA: NEGATIVE
Glucose, UA: NEGATIVE
Ketones, UA: NEGATIVE
Nitrite, UA: NEGATIVE
Specific Gravity, UA: 1.015 (ref 1.005–1.030)
Urobilinogen, Ur: 0.2 mg/dL (ref 0.2–1.0)
pH, UA: 7.5 (ref 5.0–7.5)

## 2021-01-30 LAB — MICROSCOPIC EXAMINATION
RBC, Urine: >30 /hpf — AB (ref 0–2)
Renal Epithel, UA: NONE SEEN /hpf

## 2021-01-30 LAB — BLADDER SCAN AMB NON-IMAGING: Scan Result: 12

## 2021-01-30 NOTE — Progress Notes (Signed)
01/30/2021 11:13 AM   Tammy Wyatt June 17, 1961 [id number]  Referring provider: Erasmo Downer, NP [address] [place],  [place] [address]  nephrolithiasis  HPI: Tammy Wyatt is a 59yo here for evaluation of nephrolithiasis. She was admitted 2 weeks ago to [facility] for sepsis from a left 78mm ureteral calculus. She finished a 2 week course of cipro. NO fevers. No flank pain currently. No imaging is currently available. This is her second event. He first stone event was 2 years ago and she passed the calculus. NO other associated symptoms.    PMH: Past Medical History:  Diagnosis Date  . Benign essential HTN 07/10/2015  . Depression   . Diabetes mellitus without complication (HCC)   . Hypertension   . Hypothyroidism    24 yrs ago  . Morbid obesity (HCC)   . Pre-diabetes   . Shortness of breath dyspnea    on exertion  . Sleep apnea   . URI (upper respiratory infection) 2015    Surgical History: Past Surgical History:  Procedure Laterality Date  . NO PAST SURGERIES      Home Medications:  Allergies as of 01/30/2021      Reactions   Penicillins Hives, Rash      Medication List       Accurate as of January 30, 2021 11:13 AM. If you have any questions, ask your nurse or doctor.        albuterol 108 (90 Base) MCG/ACT inhaler Commonly known as: VENTOLIN HFA Inhale 2 puffs into the lungs every 6 (six) hours as needed for wheezing or shortness of breath.   citalopram 40 MG tablet Commonly known as: CELEXA Take 40 mg by mouth daily.   furosemide 40 MG tablet Commonly known as: LASIX Take 40 mg by mouth daily.   glipiZIDE 10 MG 24 hr tablet Commonly known as: GLUCOTROL XL Take 10 mg by mouth daily.   ibuprofen 200 MG tablet Commonly known as: ADVIL Take 400 mg by mouth every 6 (six) hours as needed for mild pain.   lisinopril 40 MG tablet Commonly known as: ZESTRIL What changed: Another medication with the same name was removed. Continue taking this medication,  and follow the directions you see here. Changed by: Tammy Aye, MD   metFORMIN 500 MG 24 hr tablet Commonly known as: GLUCOPHAGE-XR Take 1,000 mg by mouth 2 (two) times daily. What changed: Another medication with the same name was removed. Continue taking this medication, and follow the directions you see here. Changed by: Tammy Aye, MD       Allergies:  Allergies  Allergen Reactions  . Penicillins Hives and Rash    Family History: Family History  Problem Relation Age of Onset  . COPD Mother        SMOKER  . Diabetes Father   . Heart failure Father   . Other Brother        GOOD HEALTH  . Other Sister        GOOD HEALTH  . Other Son        GOOD HEALTH    Social History:  reports that she has never smoked. She has never used smokeless tobacco. She reports current alcohol use. She reports that she does not use drugs.  ROS: All other review of systems were reviewed and are negative except what is noted above in HPI  Physical Exam: BP (!) 166/85   Pulse 92   Temp 98 F (36.7 C)   Ht 5'  2" (1.575 m)   Wt (!) 318 lb (144.2 kg)   BMI 58.16 kg/m   Constitutional:  Alert and oriented, No acute distress. HEENT: [place] AT, moist mucus membranes.  Trachea midline, no masses. Cardiovascular: No clubbing, cyanosis, or edema. Respiratory: Normal respiratory effort, no increased work of breathing. GI: Abdomen is soft, nontender, nondistended, no abdominal masses GU: No CVA tenderness.  Lymph: No cervical or inguinal lymphadenopathy. Skin: No rashes, bruises or suspicious lesions. Neurologic: Grossly intact, no focal deficits, moving all 4 extremities. Psychiatric: Normal mood and affect.  Laboratory Data: Lab Results  Component Value Date   WBC 6.3 11/13/2014   HGB 11.8 (L) 11/13/2014   HCT 41.5 11/13/2014   MCV 84.5 11/13/2014   PLT 205 11/13/2014    Lab Results  Component Value Date   CREATININE 0.50 11/13/2014    No results found for: PSA  No  results found for: TESTOSTERONE  No results found for: HGBA1C  Urinalysis No results found for: COLORURINE, APPEARANCEUR, LABSPEC, PHURINE, GLUCOSEU, HGBUR, BILIRUBINUR, KETONESUR, PROTEINUR, UROBILINOGEN, NITRITE, LEUKOCYTESUR  No results found for: LABMICR, WBCUA, RBCUA, LABEPIT, MUCUS, BACTERIA  Pertinent Imaging:  No results found for this or any previous visit.  No results found for this or any previous visit.  No results found for this or any previous visit.  No results found for this or any previous visit.  No results found for this or any previous visit.  No results found for this or any previous visit.  No results found for this or any previous visit.  No results found for this or any previous visit.   Assessment & Plan:    1. Urinary tract infection without hematuria, site unspecified KUB, will call with results - Urinalysis, Routine w reflex microscopic - BLADDER SCAN AMB NON-IMAGING  2. Kidney stones -We discussed the management of kidney stones. These options include observation, ureteroscopy, shockwave lithotripsy (ESWL) and percutaneous nephrolithotomy (PCNL). We discussed which options are relevant to the patient's stone(s). We discussed the natural history of kidney stones as well as the complications of untreated stones and the impact on quality of life without treatment as well as with each of the above listed treatments. We also discussed the efficacy of each treatment in its ability to clear the stone burden. With any of these management options I discussed the signs and symptoms of infection and the need for emergent treatment should these be experienced. For each option we discussed the ability of each procedure to clear the patient of their stone burden.   For observation I described the risks which include but are not limited to silent renal damage, life-threatening infection, need for emergent surgery, failure to pass stone and pain.   For  ureteroscopy I described the risks which include bleeding, infection, damage to contiguous structures, positioning injury, ureteral stricture, ureteral avulsion, ureteral injury, need for prolonged ureteral stent, inability to perform ureteroscopy, need for an interval procedure, inability to clear stone burden, stent discomfort/pain, heart attack, stroke, pulmonary embolus and the inherent risks with general anesthesia.   For shockwave lithotripsy I described the risks which include arrhythmia, kidney contusion, kidney hemorrhage, need for transfusion, pain, inability to adequately break up stone, inability to pass stone fragments, Steinstrasse, infection associated with obstructing stones, need for alternate surgical procedure, need for repeat shockwave lithotripsy, MI, CVA, PE and the inherent risks with anesthesia/conscious sedation.   For PCNL I described the risks including positioning injury, pneumothorax, hydrothorax, need for chest tube, inability to clear stone  burden, renal laceration, arterial venous fistula or malformation, need for embolization of kidney, loss of kidney or renal function, need for repeat procedure, need for prolonged nephrostomy tube, ureteral avulsion, MI, CVA, PE and the inherent risks of general anesthesia.   - The patient would like to proceed with left ureteroscopic stone extraction   No follow-ups on file.  Tammy Aye, MD  [facility] [place]

## 2021-01-30 NOTE — Progress Notes (Signed)
Bladder Scan Patient can void: 12 ml Performed By: Bridgette Habermann, lpn   Urological Symptom Review  Patient is experiencing the following symptoms: Frequent urination Get up at night to urinate Leakage of urine Urinary tract infection  Kidney stones   Review of Systems  Gastrointestinal (upper)  : Negative for upper GI symptoms  Gastrointestinal (lower) : Negative for lower GI symptoms  Constitutional : Fatigue  Skin: Negative for skin symptoms  Eyes: Negative for eye symptoms  Ear/Nose/Throat : Negative for Ear/Nose/Throat symptoms  Hematologic/Lymphatic: Negative for Hematologic/Lymphatic symptoms  Cardiovascular : Leg swelling  Respiratory : Shortness of breath  Endocrine: Negative for endocrine symptoms  Musculoskeletal: Negative for musculoskeletal symptoms  Neurological: Negative for neurological symptoms  Psychologic: Depression Anxiety

## 2021-01-30 NOTE — Patient Instructions (Signed)

## 2021-02-02 ENCOUNTER — Ambulatory Visit: Payer: BLUE CROSS/BLUE SHIELD | Admitting: Urology

## 2021-02-06 ENCOUNTER — Telehealth (INDEPENDENT_AMBULATORY_CARE_PROVIDER_SITE_OTHER): Payer: BLUE CROSS/BLUE SHIELD | Admitting: Urology

## 2021-02-06 ENCOUNTER — Other Ambulatory Visit: Payer: Self-pay

## 2021-02-06 DIAGNOSIS — N390 Urinary tract infection, site not specified: Secondary | ICD-10-CM | POA: Diagnosis not present

## 2021-02-06 NOTE — Progress Notes (Signed)
02/06/2021 3:15 PM   Tammy Wyatt [birth year] [id number]  Referring provider: No referring provider defined for this encounter.  Patient Location: Home Physician location: Office  HPI: Ms Mehra is a 59yo see today via telehealth for a left ureteral calculus. KUb reviewed with the patient and does not show a definitive stone. She has intermittent left flank pain. No other complaints today.  I connected with  Tammy Wyatt on 02/16/21 by a video enabled telemedicine application and verified that I am speaking with the correct person using two identifiers.   I discussed the limitations of evaluation and management by telemedicine. The patient expressed understanding and agreed to proceed.     PMH: Past Medical History:  Diagnosis Date  . Benign essential HTN 07/10/2015  . Depression   . Diabetes mellitus without complication (HCC)   . Hypertension   . Hypothyroidism    24 yrs ago  . Morbid obesity (HCC)   . Pre-diabetes   . Shortness of breath dyspnea    on exertion  . Sleep apnea   . URI (upper respiratory infection) 2015    Surgical History: Past Surgical History:  Procedure Laterality Date  . NO PAST SURGERIES      Home Medications:  Allergies as of 02/06/2021      Reactions   Penicillins Hives, Rash      Medication List       Accurate as of February 06, 2021  3:15 PM. If you have any questions, ask your nurse or doctor.        albuterol 108 (90 Base) MCG/ACT inhaler Commonly known as: VENTOLIN HFA Inhale 2 puffs into the lungs every 6 (six) hours as needed for wheezing or shortness of breath.   citalopram 40 MG tablet Commonly known as: CELEXA Take 40 mg by mouth daily.   furosemide 40 MG tablet Commonly known as: LASIX Take 40 mg by mouth daily.   glipiZIDE 10 MG 24 hr tablet Commonly known as: GLUCOTROL XL Take 10 mg by mouth daily.   ibuprofen 200 MG tablet Commonly known as: ADVIL Take 400 mg by mouth every 6 (six) hours as needed for  mild pain.   lisinopril 40 MG tablet Commonly known as: ZESTRIL   metFORMIN 500 MG 24 hr tablet Commonly known as: GLUCOPHAGE-XR Take 1,000 mg by mouth 2 (two) times daily.       Allergies:  Allergies  Allergen Reactions  . Penicillins Hives and Rash    Family History: Family History  Problem Relation Age of Onset  . COPD Mother        SMOKER  . Diabetes Father   . Heart failure Father   . Other Brother        GOOD HEALTH  . Other Sister        GOOD HEALTH  . Other Son        GOOD HEALTH    Social History:  reports that she has never smoked. She has never used smokeless tobacco. She reports current alcohol use. She reports that she does not use drugs.  ROS: All other review of systems were reviewed and are negative except what is noted above in HPI  Physical Exam: LMP 12/12/2014 (Exact Date)   Constitutional:  Alert and oriented, No acute distress. HEENT: [place] AT, moist mucus membranes.  Trachea midline, no masses. Cardiovascular: No clubbing, cyanosis, or edema. Respiratory: Normal respiratory effort, no increased work of breathing. GI: Abdomen is soft, nontender, nondistended, no abdominal masses  GU: No CVA tenderness.  Lymph: No cervical or inguinal lymphadenopathy. Skin: No rashes, bruises or suspicious lesions. Neurologic: Grossly intact, no focal deficits, moving all 4 extremities. Psychiatric: Normal mood and affect.  Laboratory Data: Lab Results  Component Value Date   WBC 6.3 11/13/2014   HGB 11.8 (L) 11/13/2014   HCT 41.5 11/13/2014   MCV 84.5 11/13/2014   PLT 205 11/13/2014    Lab Results  Component Value Date   CREATININE 0.50 11/13/2014    No results found for: PSA  No results found for: TESTOSTERONE  No results found for: HGBA1C  Urinalysis    Component Value Date/Time   APPEARANCEUR Clear 01/30/2021 1102   GLUCOSEU Negative 01/30/2021 1102   BILIRUBINUR Negative 01/30/2021 1102   PROTEINUR 2+ (A) 01/30/2021 1102   NITRITE  Negative 01/30/2021 1102   LEUKOCYTESUR Trace (A) 01/30/2021 1102    Lab Results  Component Value Date   LABMICR See below: 01/30/2021   WBCUA 0-5 01/30/2021   LABEPIT 0-10 01/30/2021   MUCUS Present 01/30/2021   BACTERIA Few 01/30/2021    Pertinent Imaging: KUB 01/30/2021: Images reviewed and discussed with the patient Results for orders placed in visit on 01/30/21  Abdomen 1 view (KUB)  Narrative CLINICAL DATA:  Left-sided kidney stones.  EXAM: ABDOMEN - 1 VIEW  COMPARISON:  None  FINDINGS: Left-sided nephroureteral stent is in place. No left renal calculi identified. No suspicious calcifications noted along the course of the left nephroureteral stent. Multiple calcified phleboliths identified within both sides of pelvis. Gallstones are noted within the right hemiabdomen.  IMPRESSION: Left-sided nephroureteral stent in place. No urinary tract calculi identified at this time.  Gallstones.   Electronically Signed By: Signa Kell M.D. On: 02/02/2021 09:29  No results found for this or any previous visit.  No results found for this or any previous visit.  No results found for this or any previous visit.  No results found for this or any previous visit.  No results found for this or any previous visit.  No results found for this or any previous visit.  No results found for this or any previous visit.   Assessment & Plan:    1. nephrolithiasis -We discussed the management of kidney stones. These options include observation, ureteroscopy, shockwave lithotripsy (ESWL) and percutaneous nephrolithotomy (PCNL). We discussed which options are relevant to the patient's stone(s). We discussed the natural history of kidney stones as well as the complications of untreated stones and the impact on quality of life without treatment as well as with each of the above listed treatments. We also discussed the efficacy of each treatment in its ability to clear the stone  burden. With any of these management options I discussed the signs and symptoms of infection and the need for emergent treatment should these be experienced. For each option we discussed the ability of each procedure to clear the patient of their stone burden.   For observation I described the risks which include but are not limited to silent renal damage, life-threatening infection, need for emergent surgery, failure to pass stone and pain.   For ureteroscopy I described the risks which include bleeding, infection, damage to contiguous structures, positioning injury, ureteral stricture, ureteral avulsion, ureteral injury, need for prolonged ureteral stent, inability to perform ureteroscopy, need for an interval procedure, inability to clear stone burden, stent discomfort/pain, heart attack, stroke, pulmonary embolus and the inherent risks with general anesthesia.   For shockwave lithotripsy I described the risks which include  arrhythmia, kidney contusion, kidney hemorrhage, need for transfusion, pain, inability to adequately break up stone, inability to pass stone fragments, Steinstrasse, infection associated with obstructing stones, need for alternate surgical procedure, need for repeat shockwave lithotripsy, MI, CVA, PE and the inherent risks with anesthesia/conscious sedation.   For PCNL I described the risks including positioning injury, pneumothorax, hydrothorax, need for chest tube, inability to clear stone burden, renal laceration, arterial venous fistula or malformation, need for embolization of kidney, loss of kidney or renal function, need for repeat procedure, need for prolonged nephrostomy tube, ureteral avulsion, MI, CVA, PE and the inherent risks of general anesthesia.   - The patient would like to proceed with left ureteroscopic stone extraction   No follow-ups on file.  Wilkie Aye, MD  [facility] [place]

## 2021-02-09 ENCOUNTER — Other Ambulatory Visit: Payer: Self-pay

## 2021-02-09 ENCOUNTER — Telehealth (INDEPENDENT_AMBULATORY_CARE_PROVIDER_SITE_OTHER): Payer: BLUE CROSS/BLUE SHIELD | Admitting: Urology

## 2021-02-09 ENCOUNTER — Encounter: Payer: Self-pay | Admitting: Urology

## 2021-02-09 DIAGNOSIS — N200 Calculus of kidney: Secondary | ICD-10-CM

## 2021-02-09 NOTE — Progress Notes (Signed)
  Patient no seen today

## 2021-02-10 ENCOUNTER — Telehealth: Payer: Self-pay

## 2021-02-10 NOTE — Telephone Encounter (Signed)
[facility]  Needing last visit notes on this patient.

## 2021-02-11 ENCOUNTER — Telehealth: Payer: Self-pay

## 2021-02-11 NOTE — Telephone Encounter (Signed)
Patient called needing to let [name] know she can do her procedure on April 8th.  She cannot this Friday April 1.  Wanting a call back to let her know when the procedure appt will be.  Please call pt back at [phone].  Thanks, Tammy Wyatt

## 2021-02-13 NOTE — Patient Instructions (Signed)
Tammy Wyatt  02/13/2021     @PREFPERIOPPHARMACY @   Your procedure is scheduled on  02/20/2021.   Report to [facility] at  1030  A.M.   Call this number if you have problems the morning of surgery:  [phone]   Remember:  Do not eat or drink after midnight.                        Take these medicines the morning of surgery with A SIP OF WATER amlodipine, celexa.  DO NOT take any medications for diabetes the morning of your procedure.  If your glucose id 70 or below the morning of your procedure, drink 1/2 cup of clear juice and recheck your glucose in 15 minutes. If your glucose is still 70 or below, call [phone] for instructions.  If your glucose is 300 or above the morning of your procedure, call [phone] for instructions.      Do not wear jewelry, make-up or nail polish.  Do not wear lotions, powders, or perfumes, or deodorant.  Do not shave 48 hours prior to surgery.  Men may shave face and neck.  Do not bring valuables to the hospital.  [facility] is not responsible for any belongings or valuables.   Contacts, dentures or bridgework may not be worn into surgery.  Leave your suitcase in the car.  After surgery it may be brought to your room.  For patients admitted to the hospital, discharge time will be determined by your treatment team.  Patients discharged the day of surgery will not be allowed to drive home and must have someone with then for 24 hours.  Place clean sheets on your bed the night before your procedure and DO NOT sleep with pets this night.   Shower with CHG the night before and the morning of your procedure. DO NOT put CHG on your face, hair or genitals.  After each shower, dry off with a clean towel, put on clean, comfortable clothes and brush your teeth.  DO NOT put any lotions, creams, powders or perfumes on your skin after you wash with CHG,      Special instructions:  DO NOT smoke tobacco or vape the morning of  your procedure.    Please read over the following fact sheets that you were given. Coughing and Deep Breathing, Surgical Site Infection Prevention, Anesthesia Post-op Instructions and Care and Recovery After Surgery       Ureteral Stent Implantation, Care After This sheet gives you information about how to care for yourself after your procedure. Your health care provider may also give you more specific instructions. If you have problems or questions, contact your health care provider. What can I expect after the procedure? After the procedure, it is common to have:  Nausea.  Mild pain when you urinate. You may feel this pain in your lower back or lower abdomen. The pain should stop within a few minutes after you urinate. This may last for up to 1 week.  A small amount of blood in your urine for several days. Follow these instructions at home: Medicines  Take over-the-counter and prescription medicines only as told by your health care provider.  If you were prescribed an antibiotic medicine, take it as told by your health care provider. Do not stop taking the antibiotic even if you start to feel better.  Do not drive for 24 hours if you were given  a sedative during your procedure.  Ask your health care provider if the medicine prescribed to you requires you to avoid driving or using heavy machinery. Activity  Rest as told by your health care provider.  Avoid sitting for a long time without moving. Get up to take short walks every 1-2 hours. This is important to improve blood flow and breathing. Ask for help if you feel weak or unsteady.  Return to your normal activities as told by your health care provider. Ask your health care provider what activities are safe for you. General instructions  Watch for any blood in your urine. Call your health care provider if the amount of blood in your urine increases.  If you have a catheter: ? Follow instructions from your health care  provider about taking care of your catheter and collection bag. ? Do not take baths, swim, or use a hot tub until your health care provider approves. Ask your health care provider if you may take showers. You may only be allowed to take sponge baths.  Drink enough fluid to keep your urine pale yellow.  Do not use any products that contain nicotine or tobacco, such as cigarettes, e-cigarettes, and chewing tobacco. These can delay healing after surgery. If you need help quitting, ask your health care provider.  Keep all follow-up visits as told by your health care provider. This is important.   Contact a health care provider if:  You have pain that gets worse or does not get better with medicine, especially pain when you urinate.  You have difficulty urinating.  You feel nauseous or you vomit repeatedly during a period of more than 2 days after the procedure. Get help right away if:  Your urine is dark red or has blood clots in it.  You are leaking urine (have incontinence).  The end of the stent comes out of your urethra.  You cannot urinate.  You have sudden, sharp, or severe pain in your abdomen or lower back.  You have a fever.  You have swelling or pain in your legs.  You have difficulty breathing. Summary  After the procedure, it is common to have mild pain when you urinate that goes away within a few minutes after you urinate. This may last for up to 1 week.  Watch for any blood in your urine. Call your health care provider if the amount of blood in your urine increases.  Take over-the-counter and prescription medicines only as told by your health care provider.  Drink enough fluid to keep your urine pale yellow. This information is not intended to replace advice given to you by your health care provider. Make sure you discuss any questions you have with your health care provider. Document Revised: 08/08/2018 Document Reviewed: 08/09/2018 Elsevier Patient Education   2021 [employer]. General Anesthesia, Adult, Care After This sheet gives you information about how to care for yourself after your procedure. Your health care provider may also give you more specific instructions. If you have problems or questions, contact your health care provider. What can I expect after the procedure? After the procedure, the following side effects are common:  Pain or discomfort at the IV site.  Nausea.  Vomiting.  Sore throat.  Trouble concentrating.  Feeling cold or chills.  Feeling weak or tired.  Sleepiness and fatigue.  Soreness and body aches. These side effects can affect parts of the body that were not involved in surgery. Follow these instructions at home: For the  time period you were told by your health care provider:  Rest.  Do not participate in activities where you could fall or become injured.  Do not drive or use machinery.  Do not drink alcohol.  Do not take sleeping pills or medicines that cause drowsiness.  Do not make important decisions or sign legal documents.  Do not take care of children on your own.   Eating and drinking  Follow any instructions from your health care provider about eating or drinking restrictions.  When you feel hungry, start by eating small amounts of foods that are soft and easy to digest (bland), such as toast. Gradually return to your regular diet.  Drink enough fluid to keep your urine pale yellow.  If you vomit, rehydrate by drinking water, juice, or clear broth. General instructions  If you have sleep apnea, surgery and certain medicines can increase your risk for breathing problems. Follow instructions from your health care provider about wearing your sleep device: ? Anytime you are sleeping, including during daytime naps. ? While taking prescription pain medicines, sleeping medicines, or medicines that make you drowsy.  Have a responsible adult stay with you for the time you are told. It is  important to have someone help care for you until you are awake and alert.  Return to your normal activities as told by your health care provider. Ask your health care provider what activities are safe for you.  Take over-the-counter and prescription medicines only as told by your health care provider.  If you smoke, do not smoke without supervision.  Keep all follow-up visits as told by your health care provider. This is important. Contact a health care provider if:  You have nausea or vomiting that does not get better with medicine.  You cannot eat or drink without vomiting.  You have pain that does not get better with medicine.  You are unable to pass urine.  You develop a skin rash.  You have a fever.  You have redness around your IV site that gets worse. Get help right away if:  You have difficulty breathing.  You have chest pain.  You have blood in your urine or stool, or you vomit blood. Summary  After the procedure, it is common to have a sore throat or nausea. It is also common to feel tired.  Have a responsible adult stay with you for the time you are told. It is important to have someone help care for you until you are awake and alert.  When you feel hungry, start by eating small amounts of foods that are soft and easy to digest (bland), such as toast. Gradually return to your regular diet.  Drink enough fluid to keep your urine pale yellow.  Return to your normal activities as told by your health care provider. Ask your health care provider what activities are safe for you. This information is not intended to replace advice given to you by your health care provider. Make sure you discuss any questions you have with your health care provider. Document Revised: 07/17/2020 Document Reviewed: 02/14/2020 Elsevier Patient Education  2021 [employer].

## 2021-02-16 ENCOUNTER — Encounter: Payer: Self-pay | Admitting: Urology

## 2021-02-16 NOTE — Patient Instructions (Signed)

## 2021-02-18 ENCOUNTER — Encounter (HOSPITAL_COMMUNITY)
Admission: RE | Admit: 2021-02-18 | Discharge: 2021-02-18 | Disposition: A | Discharge status: Home / Self Care | Payer: BLUE CROSS/BLUE SHIELD | Source: Ambulatory Visit | Attending: Urology | Admitting: Urology

## 2021-02-18 ENCOUNTER — Other Ambulatory Visit: Payer: Self-pay

## 2021-02-18 ENCOUNTER — Encounter (HOSPITAL_COMMUNITY): Payer: Self-pay

## 2021-02-18 ENCOUNTER — Other Ambulatory Visit (HOSPITAL_COMMUNITY)
Admission: RE | Admit: 2021-02-18 | Discharge: 2021-02-18 | Disposition: A | Discharge status: Home / Self Care | Payer: BLUE CROSS/BLUE SHIELD | Source: Ambulatory Visit | Attending: Urology | Admitting: Urology

## 2021-02-18 DIAGNOSIS — Z01818 Encounter for other preprocedural examination: Secondary | ICD-10-CM | POA: Insufficient documentation

## 2021-02-18 DIAGNOSIS — Z20822 Contact with and (suspected) exposure to covid-19: Secondary | ICD-10-CM | POA: Insufficient documentation

## 2021-02-18 LAB — CBC WITH DIFFERENTIAL/PLATELET
Abs Immature Granulocytes: 0.02 10*3/uL (ref 0.00–0.07)
Basophils Absolute: 0 10*3/uL (ref 0.0–0.1)
Basophils Relative: 1 %
Eosinophils Absolute: 0.1 10*3/uL (ref 0.0–0.5)
Eosinophils Relative: 1 %
HCT: 46.3 % — ABNORMAL HIGH (ref 36.0–46.0)
Hemoglobin: 14.6 g/dL (ref 12.0–15.0)
Immature Granulocytes: 0 %
Lymphocytes Relative: 25 %
Lymphs Abs: 1.6 10*3/uL (ref 0.7–4.0)
MCH: 29.6 pg (ref 26.0–34.0)
MCHC: 31.5 g/dL (ref 30.0–36.0)
MCV: 93.7 fL (ref 80.0–100.0)
Monocytes Absolute: 0.4 10*3/uL (ref 0.1–1.0)
Monocytes Relative: 6 %
Neutro Abs: 4.3 10*3/uL (ref 1.7–7.7)
Neutrophils Relative %: 67 %
Platelets: 166 10*3/uL (ref 150–400)
RBC: 4.94 MIL/uL (ref 3.87–5.11)
RDW: 12.4 % (ref 11.5–15.5)
WBC: 6.4 10*3/uL (ref 4.0–10.5)
nRBC: 0 % (ref 0.0–0.2)

## 2021-02-18 LAB — BASIC METABOLIC PANEL
Anion gap: 11 (ref 5–15)
BUN: 9 mg/dL (ref 6–20)
CO2: 29 mmol/L (ref 22–32)
Calcium: 8.7 mg/dL — ABNORMAL LOW (ref 8.9–10.3)
Chloride: 99 mmol/L (ref 98–111)
Creatinine, Ser: 0.59 mg/dL (ref 0.44–1.00)
GFR, Estimated: >60 mL/min (ref >60)
Glucose, Bld: 233 mg/dL — ABNORMAL HIGH (ref 70–99)
Potassium: 4.2 mmol/L (ref 3.5–5.1)
Sodium: 139 mmol/L (ref 135–145)

## 2021-02-18 LAB — HEMOGLOBIN A1C
Hgb A1c MFr Bld: 11.5 % — ABNORMAL HIGH (ref 4.8–5.6)
Mean Plasma Glucose: 283.35 mg/dL

## 2021-02-18 LAB — SARS CORONAVIRUS 2 (TAT 6-24 HRS): SARS Coronavirus 2: NEGATIVE

## 2021-02-18 NOTE — Pre-Procedure Instructions (Signed)
HgbA1C routed to pcp.

## 2021-02-20 ENCOUNTER — Encounter (HOSPITAL_COMMUNITY): Payer: Self-pay | Admitting: Certified Registered Nurse Anesthetist

## 2021-02-20 ENCOUNTER — Ambulatory Visit (HOSPITAL_COMMUNITY)
Admission: RE | Admit: 2021-02-20 | Discharge: 2021-02-20 | Disposition: A | Discharge status: Home / Self Care | Payer: BLUE CROSS/BLUE SHIELD | Attending: Urology | Admitting: Urology

## 2021-02-20 ENCOUNTER — Encounter (HOSPITAL_COMMUNITY): Payer: Self-pay | Admitting: Urology

## 2021-02-20 ENCOUNTER — Telehealth: Payer: Self-pay

## 2021-02-20 ENCOUNTER — Encounter (HOSPITAL_COMMUNITY)
Admission: RE | Disposition: A | Discharge status: Home / Self Care | Payer: Self-pay | Source: Home / Self Care | Attending: Urology

## 2021-02-20 SURGERY — CYSTOURETEROSCOPY, WITH RETROGRADE PYELOGRAM AND STENT INSERTION
Anesthesia: General | Laterality: Left

## 2021-02-20 MED ORDER — SODIUM CHLORIDE 0.9 % IV SOLN
INTRAVENOUS | Status: AC
  Filled 2021-02-20: qty 20

## 2021-02-20 MED ORDER — ONDANSETRON HCL 4 MG/2ML IJ SOLN
INTRAMUSCULAR | Status: AC
  Filled 2021-02-20: qty 4

## 2021-02-20 MED ORDER — FENTANYL CITRATE (PF) 100 MCG/2ML IJ SOLN
INTRAMUSCULAR | Status: AC
  Filled 2021-02-20: qty 2

## 2021-02-20 MED ORDER — PROPOFOL 10 MG/ML IV BOLUS
INTRAVENOUS | Status: AC
  Filled 2021-02-20: qty 20

## 2021-02-20 MED ORDER — DIATRIZOATE MEGLUMINE 30 % UR SOLN
URETHRAL | Status: AC
  Filled 2021-02-20: qty 100

## 2021-02-20 MED ORDER — DEXAMETHASONE SODIUM PHOSPHATE 10 MG/ML IJ SOLN
INTRAMUSCULAR | Status: AC
  Filled 2021-02-20: qty 1

## 2021-02-20 MED ORDER — SODIUM CHLORIDE 0.9 % IV SOLN: 2.0000 g | INTRAVENOUS | Status: DC

## 2021-02-20 MED ORDER — LIDOCAINE HCL (PF) 2 % IJ SOLN
INTRAMUSCULAR | Status: AC
  Filled 2021-02-20: qty 10

## 2021-02-20 NOTE — Telephone Encounter (Signed)
Patient arrived to hospital for procedure this am.  CPT codes 16606, 74420, 52351 ,[id number] all approved if within network- No PA needed.  Patient has [employer]. [facility] is not under umbrella. Patient falls in out-of-network insurance. Patient stated she does not wish to pay out-of-network.  Called [employer] to start [facility] for [facility] facility approval to have procedure completed. Today's surgery has been postponed pending decision for procedure codes 10932, 74420, 52351 ,52356.  Dr. Ronne Binning aware as well. Patient denies needing pain medication at this time. Informed patient I will be in touch if exception is approved. Patient voiced understanding.

## 2021-02-20 NOTE — Progress Notes (Signed)
Surgery cancelled d/t [facility] out of insurance network. Office is working to clarify issue. Dr. Ronne Binning aware. Pt will reschedule if new findings occur.

## 2021-02-26 ENCOUNTER — Telehealth: Payer: Self-pay

## 2021-02-26 NOTE — Telephone Encounter (Signed)
Received [employer] determination for out of network surgery.  Denial obtained- per The request does not meet the definition of medical necessity found the members benefit booklet- because there is an in-network option reasonably available to treat the patient condition.   Patient called and notified.  Recommending patient call back to [place], [place] where she was seen and had stent placed as per pt her insurance covered her previous procedure.  Patient voiced understanding.

## 2021-03-03 ENCOUNTER — Ambulatory Visit: Payer: BLUE CROSS/BLUE SHIELD | Admitting: Urology

## 2021-07-11 IMAGING — DX DG ABDOMEN 1V
2 series · 2 of 2 positions shown · non-contrast
Comparison: None

CLINICAL DATA: Left-sided kidney stones.

EXAM:
ABDOMEN - 1 VIEW

[abdomen kub (1 of 2)]
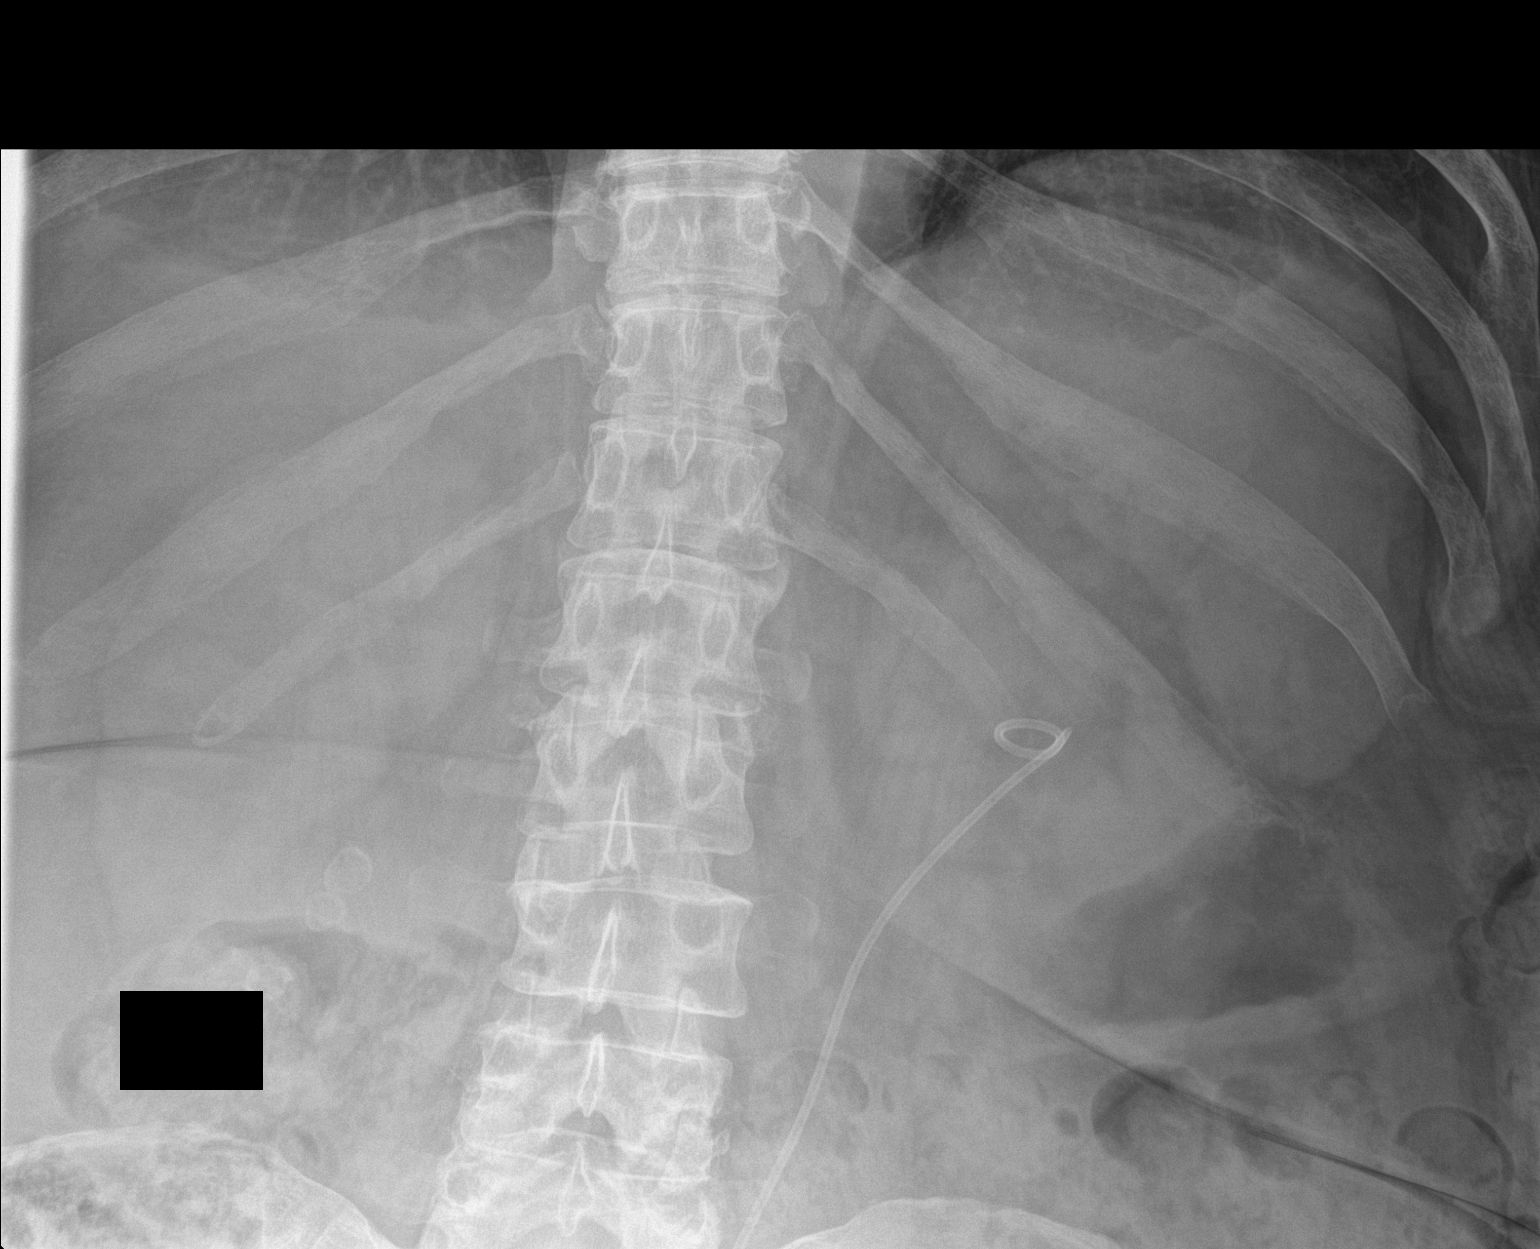

[abdomen kub (2 of 2)]
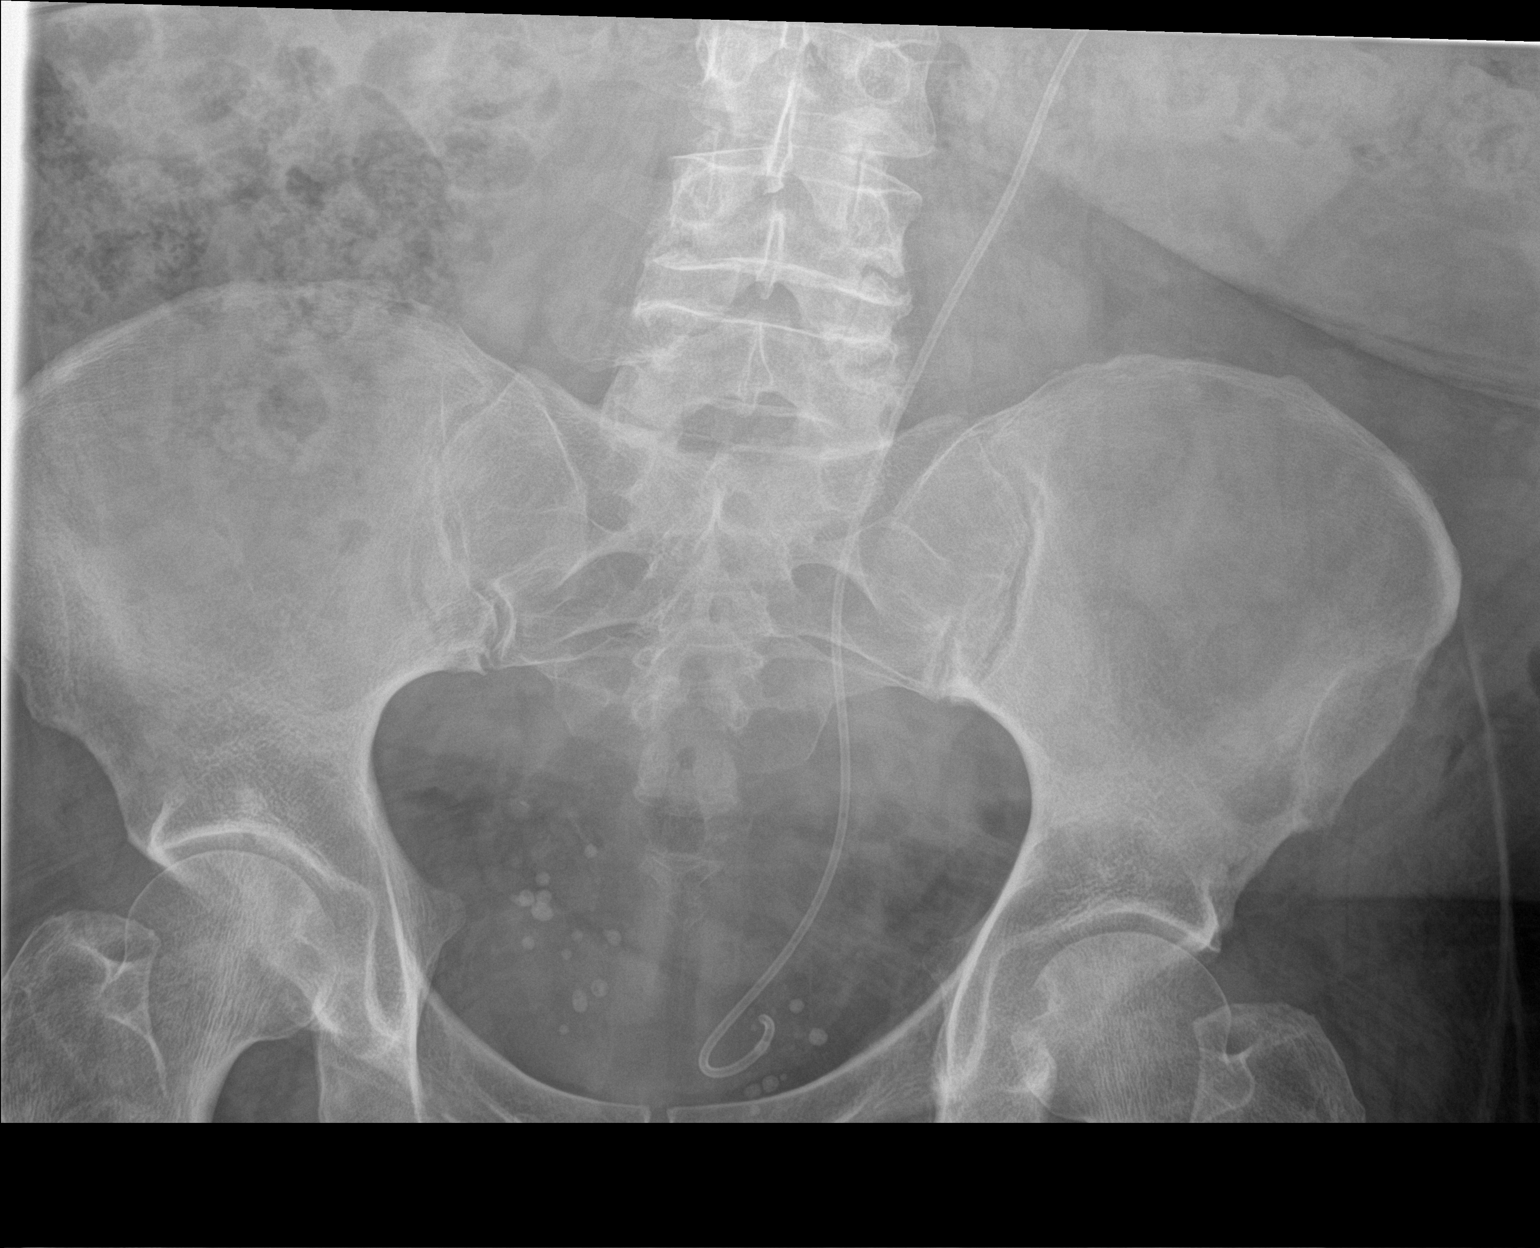

[2 of 2 positions shown; findings below may reference images not displayed]

FINDINGS: Left-sided nephroureteral stent is in place. No left renal calculi
identified. No suspicious calcifications noted along the course of
the left nephroureteral stent. Multiple calcified phleboliths
identified within both sides of pelvis. Gallstones are noted within
the right hemiabdomen.
IMPRESSION: Left-sided nephroureteral stent in place. No urinary tract calculi
identified at this time.

Gallstones.

## 2023-06-05 ENCOUNTER — Ambulatory Visit
Admission: EM | Admit: 2023-06-05 | Discharge: 2023-06-05 | Disposition: A | Discharge status: Home / Self Care | Payer: BLUE CROSS/BLUE SHIELD | Attending: Nurse Practitioner | Admitting: Nurse Practitioner

## 2023-06-05 ENCOUNTER — Ambulatory Visit (INDEPENDENT_AMBULATORY_CARE_PROVIDER_SITE_OTHER): Payer: BLUE CROSS/BLUE SHIELD

## 2023-06-05 ENCOUNTER — Encounter: Payer: Self-pay | Admitting: Emergency Medicine

## 2023-06-05 DIAGNOSIS — Z8739 Personal history of other diseases of the musculoskeletal system and connective tissue: Secondary | ICD-10-CM | POA: Diagnosis not present

## 2023-06-05 DIAGNOSIS — M25561 Pain in right knee: Secondary | ICD-10-CM

## 2023-06-05 MED ORDER — MELOXICAM 7.5 MG PO TABS: 7.5000 mg | ORAL_TABLET | Freq: Every day | ORAL | 0 refills | Status: AC

## 2023-06-05 NOTE — ED Triage Notes (Signed)
Fell 4 weeks ago and had to get on knees to get up.  C/o of right knee pain.  Hurts worse when walking

## 2023-06-05 NOTE — Discharge Instructions (Addendum)
The x-ray is negative for fracture or dislocation.  As discussed, the x-ray does show osteoarthritis of the right knee. Take medication as prescribed.  Make sure you take food and water when taking the medication. May take over-the-counter Tylenol arthritis strength 650 mg tablets as needed for breakthrough pain or discomfort. RICE therapy, rest, ice, compression, and elevation.  You have been provided an Ace wrap to provide compression and support.  Wear the Ace wrap when engaged in prolonged or strenuous activity.  Apply ice for 20 minutes, remove for 1 hour, then repeat as much as possible.  Elevate the right lower extremity above the level of the heart as needed. If symptoms fail to improve with this treatment, please follow-up with your primary care physician or with orthopedics for further evaluation.  You can follow-up with [facility] or with [facility] [place]. Follow-up as needed.

## 2023-06-05 NOTE — ED Provider Notes (Signed)
[facility]    CSN: [id number] Arrival date & time: 06/05/23  1258      History   Chief Complaint No chief complaint on file.   HPI Tammy Wyatt is a 62 y.o. female.   The history is provided by the patient and a relative.   The patient presents for complaints of right knee pain.  Patient states symptoms have been present for the past 4 weeks after she fell.  Patient states that she landed on her back, but states when she fell she had to get up, and she had to use her right knee.  Since that time, she has noticed pain in the right knee, but states over the past 1 to 2 days, symptoms worsen.  She denies fever, chills, swelling, numbness, tingling, or radiation of pain.  Patient reports she has been applying ice and taking Tylenol with minimal relief.  Patient reports a prior history of osteoarthritis.  Past Medical History:  Diagnosis Date   Benign essential HTN 07/10/2015   Depression    Diabetes mellitus without complication (HCC)    Hypertension    Hypothyroidism    24 yrs ago   Morbid obesity (HCC)    Pre-diabetes    Shortness of breath dyspnea    on exertion   Sleep apnea    cannot tolerate, pcp aware.   URI (upper respiratory infection) 2015    Patient Active Problem List   Diagnosis Date Noted   Benign essential HTN 07/10/2015   Pre-diabetes    Morbid obesity (HCC)     Past Surgical History:  Procedure Laterality Date   DILATION AND CURETTAGE OF UTERUS      OB History   No obstetric history on file.      Home Medications    Prior to Admission medications   Medication Sig Start Date End Date Taking? Authorizing Provider  meloxicam (MOBIC) 7.5 MG tablet Take 1 tablet (7.5 mg total) by mouth daily. 06/05/23  Yes Tifanie Gardiner-Warren, Sadie Haber, NP  albuterol (PROVENTIL HFA;VENTOLIN HFA) 108 (90 BASE) MCG/ACT inhaler Inhale 2 puffs into the lungs every 6 (six) hours as needed for wheezing or shortness of breath. Patient not taking: No sig reported  01/02/15   Rodolph Bong, MD  amLODipine (NORVASC) 5 MG tablet Take 5 mg by mouth daily. 02/02/21   [provider]  citalopram (CELEXA) 40 MG tablet Take 40 mg by mouth daily.    [provider]  glipiZIDE (GLUCOTROL XL) 10 MG 24 hr tablet Take 10 mg by mouth daily. 01/02/21   [provider]  lisinopril (ZESTRIL) 40 MG tablet Take 40 mg by mouth daily. 10/01/20   [provider]  metFORMIN (GLUCOPHAGE-XR) 500 MG 24 hr tablet Take 1,000 mg by mouth 2 (two) times daily. 10/21/20   [provider]  OZEMPIC, 0.25 OR 0.5 MG/DOSE, 2 MG/1.5ML SOPN Inject 0.25 mg into the skin every 7 (seven) days. 02/03/21   [provider]    Family History Family History  Problem Relation Age of Onset   COPD Mother        SMOKER   Diabetes Father    Heart failure Father    Other Brother        GOOD HEALTH   Other Sister        GOOD HEALTH   Other Son        GOOD HEALTH    Social History Social History   Tobacco Use   Smoking  status: Never   Smokeless tobacco: Never  Vaping Use   Vaping status: Never Used  Substance Use Topics   Alcohol use: Yes    Comment: rarely   Drug use: No     Allergies   Penicillins   Review of Systems Review of Systems Per HPI  Physical Exam Triage Vital Signs ED Triage Vitals  Encounter Vitals Group     BP 06/05/23 1301 (!) 158/84     Systolic BP Percentile --      Diastolic BP Percentile --      Pulse Rate 06/05/23 1301 67     Resp 06/05/23 1301 18     Temp 06/05/23 1301 98.1 F (36.7 C)     Temp Source 06/05/23 1301 Oral     SpO2 06/05/23 1301 92 %     Weight --      Height --      Head Circumference --      Peak Flow --      Pain Score 06/05/23 1303 8     Pain Loc --      Pain Education --      Exclude from Growth Chart --    No data found.  Updated Vital Signs BP (!) 158/84 (BP Location: Right Arm)   Pulse 67   Temp 98.1 F (36.7 C) (Oral)   Resp 18   LMP 12/12/2014 (Exact Date)    SpO2 92%   Visual Acuity Right Eye Distance:   Left Eye Distance:   Bilateral Distance:    Right Eye Near:   Left Eye Near:    Bilateral Near:     Physical Exam Vitals and nursing note reviewed.  Constitutional:      General: She is not in acute distress.    Appearance: Normal appearance.  HENT:     Head: Normocephalic.  Eyes:     Extraocular Movements: Extraocular movements intact.     Pupils: Pupils are equal, round, and reactive to light.  Pulmonary:     Effort: Pulmonary effort is normal.  Musculoskeletal:     Cervical back: Normal range of motion.     Right knee: No swelling. Normal range of motion. Tenderness present over the medial joint line, MCL and patellar tendon.  Skin:    General: Skin is warm and dry.  Neurological:     General: No focal deficit present.     Mental Status: She is alert and oriented to person, place, and time.  Psychiatric:        Mood and Affect: Mood normal.        Behavior: Behavior normal.      UC Treatments / Results  Labs (all labs ordered are listed, but only abnormal results are displayed) Labs Reviewed - No data to display  EKG   Radiology DG Knee Complete 4 Views Right  Result Date: 06/05/2023 CLINICAL DATA:  right knee pain x 4 weeks s/p fall EXAM: RIGHT KNEE - COMPLETE 4+ VIEW COMPARISON:  None Available. FINDINGS: No fracture or dislocation. No effusion. Marginal spurring about all 3 compartments of the knee. Alignment preserved. Regional soft tissues unremarkable. IMPRESSION: 1. No fracture or other acute findings. 2. Tricompartmental osteoarthritis. Electronically Signed   By: Corlis Leak M.D.   On: 06/05/2023 13:19    Procedures Procedures (including critical care time)  Medications Ordered in UC Medications - No data to display  Initial Impression / Assessment and Plan / UC Course  I have reviewed the triage vital  signs and the nursing notes.  Pertinent labs & imaging results that were available during my care  of the patient were reviewed by me and considered in my medical decision making (see chart for details).  X-ray is negative for fracture or dislocation.  X-ray does show tricompartmental osteoarthritis, which is likely causing the patient's right knee pain.  Will treat with meloxicam 7.5 mg daily.  Patient was also provided with an Ace wrap to provide compression and support of the right knee.  Supportive care recommendations were provided and discussed with the patient to include RICE therapy, and use of Tylenol arthritis strength 650 mg tablets as needed for breakthrough pain or discomfort.  Patient was advised to follow-up with orthopedics or with her PCP if symptoms fail to improve.  Patient is in agreement with this plan of care and verbalizes understanding.  All questions were answered.  Patient stable for discharge.   Final Clinical Impressions(s) / UC Diagnoses   Final diagnoses:  Right knee pain, unspecified chronicity  History of osteoarthritis     Discharge Instructions      The x-ray is negative for fracture or dislocation.  As discussed, the x-ray does show osteoarthritis of the right knee. Take medication as prescribed.  Make sure you take food and water when taking the medication. May take over-the-counter Tylenol arthritis strength 650 mg tablets as needed for breakthrough pain or discomfort. RICE therapy, rest, ice, compression, and elevation.  You have been provided an Ace wrap to provide compression and support.  Wear the Ace wrap when engaged in prolonged or strenuous activity.  Apply ice for 20 minutes, remove for 1 hour, then repeat as much as possible.  Elevate the right lower extremity above the level of the heart as needed. If symptoms fail to improve with this treatment, please follow-up with your primary care physician or with orthopedics for further evaluation.  You can follow-up with [facility] or with [facility] [place]. Follow-up as needed.     ED  Prescriptions     Medication Sig Dispense Auth. Provider   meloxicam (MOBIC) 7.5 MG tablet Take 1 tablet (7.5 mg total) by mouth daily. 20 tablet Gabriell Casimir-Warren, Sadie Haber, NP      PDMP not reviewed this encounter.   Abran Cantor, NP 06/05/23 1341

## 2023-08-06 ENCOUNTER — Ambulatory Visit
Admission: EM | Admit: 2023-08-06 | Discharge: 2023-08-06 | Disposition: A | Discharge status: Home / Self Care | Payer: BLUE CROSS/BLUE SHIELD | Attending: Family Medicine | Admitting: Family Medicine

## 2023-08-06 DIAGNOSIS — R310 Gross hematuria: Secondary | ICD-10-CM

## 2023-08-06 LAB — POCT URINALYSIS DIP (MANUAL ENTRY)
Glucose, UA: NEGATIVE mg/dL
Ketones, POC UA: NEGATIVE mg/dL
Leukocytes, UA: NEGATIVE
Nitrite, UA: NEGATIVE
Protein Ur, POC: 100 mg/dL — AB
Spec Grav, UA: >=1.03 — AB (ref 1.010–1.025)
Urobilinogen, UA: 0.2 E.U./dL
pH, UA: 5.5 (ref 5.0–8.0)

## 2023-08-06 NOTE — Discharge Instructions (Signed)
Follow-up with your primary care provider on Monday for a recheck of your symptoms.  Make sure to stay well-hydrated, follow-up sooner if worsening

## 2023-08-06 NOTE — ED Provider Notes (Signed)
[facility]    CSN: [id number] Arrival date & time: 08/06/23  4782      History   Chief Complaint No chief complaint on file.   HPI Tammy Wyatt is a 62 y.o. female.   Patient presenting today with 1 week history of gross hematuria.  States the blood has become darker throughout the week but was fairly bright at onset.  Denies any associated fever, chills, nausea, vomiting, bowel changes, abdominal pelvic or flank pain, dysuria, vaginal symptoms.  She denies any spotting in between urination.  Sober not sure anything over-the-counter for symptoms.  States the last time she had something like this it was a kidney stone causing the bleeding, however she has not had any pain this time.  Patient is postmenopausal.  She is not on any anticoagulation and she has not had any recent medication changes or lifestyle changes.    Past Medical History:  Diagnosis Date   Benign essential HTN 07/10/2015   Depression    Diabetes mellitus without complication (HCC)    Hypertension    Hypothyroidism    24 yrs ago   Morbid obesity (HCC)    Pre-diabetes    Shortness of breath dyspnea    on exertion   Sleep apnea    cannot tolerate, pcp aware.   URI (upper respiratory infection) 2015    Patient Active Problem List   Diagnosis Date Noted   Benign essential HTN 07/10/2015   Pre-diabetes    Morbid obesity (HCC)     Past Surgical History:  Procedure Laterality Date   CATARACT EXTRACTION     DILATION AND CURETTAGE OF UTERUS      OB History   No obstetric history on file.      Home Medications    Prior to Admission medications   Medication Sig Start Date End Date Taking? Authorizing Provider  albuterol (PROVENTIL HFA;VENTOLIN HFA) 108 (90 BASE) MCG/ACT inhaler Inhale 2 puffs into the lungs every 6 (six) hours as needed for wheezing or shortness of breath. Patient not taking: No sig reported 01/02/15   Rodolph Bong, MD  amLODipine (NORVASC) 5 MG tablet Take 5 mg by  mouth daily. 02/02/21   [provider]  citalopram (CELEXA) 40 MG tablet Take 40 mg by mouth daily.    [provider]  glipiZIDE (GLUCOTROL XL) 10 MG 24 hr tablet Take 10 mg by mouth daily. 01/02/21   [provider]  lisinopril (ZESTRIL) 40 MG tablet Take 40 mg by mouth daily. 10/01/20   [provider]  meloxicam (MOBIC) 7.5 MG tablet Take 1 tablet (7.5 mg total) by mouth daily. 06/05/23   Leath-Warren, Sadie Haber, NP  metFORMIN (GLUCOPHAGE-XR) 500 MG 24 hr tablet Take 1,000 mg by mouth 2 (two) times daily. 10/21/20   [provider]  OZEMPIC, 0.25 OR 0.5 MG/DOSE, 2 MG/1.5ML SOPN Inject 0.25 mg into the skin every 7 (seven) days. 02/03/21   [provider]    Family History Family History  Problem Relation Age of Onset   COPD Mother        SMOKER   Diabetes Father    Heart failure Father    Other Brother        GOOD HEALTH   Other Sister        GOOD HEALTH   Other Son        GOOD HEALTH    Social History Social History   Tobacco Use   Smoking status: Never  Smokeless tobacco: Never  Vaping Use   Vaping status: Never Used  Substance Use Topics   Alcohol use: Yes    Comment: rarely   Drug use: No     Allergies   Penicillins   Review of Systems Review of Systems Per HPI  Physical Exam Triage Vital Signs ED Triage Vitals  Encounter Vitals Group     BP 08/06/23 0920 134/82     Systolic BP Percentile --      Diastolic BP Percentile --      Pulse Rate 08/06/23 0920 69     Resp 08/06/23 0920 16     Temp 08/06/23 0920 98.7 F (37.1 C)     Temp Source 08/06/23 0920 Oral     SpO2 08/06/23 0920 93 %     Weight --      Height --      Head Circumference --      Peak Flow --      Pain Score 08/06/23 0922 0     Pain Loc --      Pain Education --      Exclude from Growth Chart --    No data found.  Updated Vital Signs BP 134/82 (BP Location: Right Arm)   Pulse 69   Temp 98.7 F (37.1 C) (Oral)   Resp 16    LMP 12/12/2014 (Exact Date)   SpO2 93%   Visual Acuity Right Eye Distance:   Left Eye Distance:   Bilateral Distance:    Right Eye Near:   Left Eye Near:    Bilateral Near:     Physical Exam Vitals and nursing note reviewed.  Constitutional:      Appearance: Normal appearance. She is not ill-appearing.  HENT:     Head: Atraumatic.  Eyes:     Extraocular Movements: Extraocular movements intact.     Conjunctiva/sclera: Conjunctivae normal.  Cardiovascular:     Rate and Rhythm: Normal rate and regular rhythm.     Heart sounds: Normal heart sounds.  Pulmonary:     Effort: Pulmonary effort is normal.     Breath sounds: Normal breath sounds.  Abdominal:     General: Bowel sounds are normal. There is no distension.     Palpations: Abdomen is soft.     Tenderness: There is no abdominal tenderness. There is no right CVA tenderness, left CVA tenderness or guarding.  Musculoskeletal:        General: Normal range of motion.     Cervical back: Normal range of motion and neck supple.  Skin:    General: Skin is warm and dry.  Neurological:     Mental Status: She is alert and oriented to person, place, and time.  Psychiatric:        Mood and Affect: Mood normal.        Thought Content: Thought content normal.        Judgment: Judgment normal.      UC Treatments / Results  Labs (all labs ordered are listed, but only abnormal results are displayed) Labs Reviewed  POCT URINALYSIS DIP (MANUAL ENTRY) - Abnormal; Notable for the following components:      Result Value   Color, UA brown (*)    Clarity, UA cloudy (*)    Bilirubin, UA small (*)    Spec Grav, UA >=1.030 (*)    Blood, UA large (*)    Protein Ur, POC =100 (*)    All other components within normal limits  EKG   Radiology No results found.  Procedures Procedures (including critical care time)  Medications Ordered in UC Medications - No data to display  Initial Impression / Assessment and Plan / UC  Course  I have reviewed the triage vital signs and the nursing notes.  Pertinent labs & imaging results that were available during my care of the patient were reviewed by me and considered in my medical decision making (see chart for details).     Vital signs and exam benign and reassuring today with no obvious abnormalities, urinalysis with no evidence of a urinary tract infection today but does reveal protein, blood.  Possibly inflammatory versus kidney stones, offered blood work today for further evaluation and safety check, patient declines and wishes to follow-up for this with PCP next week.  Did recommend follow-up for recheck of symptoms as well as returning sooner if worsening anytime.  Discussed good hydration additionally.  Final Clinical Impressions(s) / UC Diagnoses   Final diagnoses:  Gross hematuria     Discharge Instructions      Follow-up with your primary care provider on Monday for a recheck of your symptoms.  Make sure to stay well-hydrated, follow-up sooner if worsening    ED Prescriptions   None    PDMP not reviewed this encounter.   Particia Nearing, [place] 08/06/23 1038

## 2023-08-06 NOTE — ED Triage Notes (Signed)
Pt reports she has blood in her urine x 1 week
